# Patient Record
Sex: Male | Born: 1935
Health system: Southern US, Community
[De-identification: ages and names within clinical notes are randomized; demographics above are authoritative.]

## PROBLEM LIST (undated history)

## (undated) DIAGNOSIS — M51369 Other intervertebral disc degeneration, lumbar region without mention of lumbar back pain or lower extremity pain: Secondary | ICD-10-CM

## (undated) DIAGNOSIS — M1990 Unspecified osteoarthritis, unspecified site: Secondary | ICD-10-CM

## (undated) DIAGNOSIS — F32A Depression, unspecified: Secondary | ICD-10-CM

## (undated) DIAGNOSIS — R569 Unspecified convulsions: Secondary | ICD-10-CM

## (undated) DIAGNOSIS — M5030 Other cervical disc degeneration, unspecified cervical region: Secondary | ICD-10-CM

## (undated) DIAGNOSIS — E785 Hyperlipidemia, unspecified: Secondary | ICD-10-CM

## (undated) DIAGNOSIS — E875 Hyperkalemia: Secondary | ICD-10-CM

## (undated) DIAGNOSIS — N400 Enlarged prostate without lower urinary tract symptoms: Secondary | ICD-10-CM

## (undated) DIAGNOSIS — N529 Male erectile dysfunction, unspecified: Secondary | ICD-10-CM

## (undated) DIAGNOSIS — F419 Anxiety disorder, unspecified: Secondary | ICD-10-CM

## (undated) DIAGNOSIS — I10 Essential (primary) hypertension: Secondary | ICD-10-CM

## (undated) DIAGNOSIS — R972 Elevated prostate specific antigen [PSA]: Secondary | ICD-10-CM

## (undated) DIAGNOSIS — F329 Major depressive disorder, single episode, unspecified: Secondary | ICD-10-CM

## (undated) DIAGNOSIS — M1710 Unilateral primary osteoarthritis, unspecified knee: Secondary | ICD-10-CM

## (undated) DIAGNOSIS — E538 Deficiency of other specified B group vitamins: Secondary | ICD-10-CM

## (undated) DIAGNOSIS — E119 Type 2 diabetes mellitus without complications: Secondary | ICD-10-CM

## (undated) DIAGNOSIS — M5136 Other intervertebral disc degeneration, lumbar region: Secondary | ICD-10-CM

## (undated) DIAGNOSIS — E559 Vitamin D deficiency, unspecified: Secondary | ICD-10-CM

## (undated) DIAGNOSIS — H8113 Benign paroxysmal vertigo, bilateral: Secondary | ICD-10-CM

## (undated) HISTORY — DX: Anxiety disorder, unspecified: F41.9

## (undated) HISTORY — DX: Benign paroxysmal vertigo, bilateral: H81.13

## (undated) HISTORY — DX: Deficiency of other specified B group vitamins: E53.8

## (undated) HISTORY — DX: Major depressive disorder, single episode, unspecified: F32.9

## (undated) HISTORY — DX: Other intervertebral disc degeneration, lumbar region: M51.36

## (undated) HISTORY — DX: Other cervical disc degeneration, unspecified cervical region: M50.30

## (undated) HISTORY — DX: Male erectile dysfunction, unspecified: N52.9

## (undated) HISTORY — DX: Other intervertebral disc degeneration, lumbar region without mention of lumbar back pain or lower extremity pain: M51.369

## (undated) HISTORY — DX: Depression, unspecified: F32.A

## (undated) HISTORY — DX: Benign prostatic hyperplasia without lower urinary tract symptoms: N40.0

## (undated) HISTORY — DX: Hyperlipidemia, unspecified: E78.5

## (undated) HISTORY — DX: Hyperkalemia: E87.5

## (undated) HISTORY — PX: KNEE CARTILAGE SURGERY: SHX688

## (undated) HISTORY — DX: Unspecified osteoarthritis, unspecified site: M19.90

## (undated) HISTORY — DX: Elevated prostate specific antigen (PSA): R97.20

## (undated) HISTORY — DX: Vitamin D deficiency, unspecified: E55.9

## (undated) HISTORY — DX: Unilateral primary osteoarthritis, unspecified knee: M17.10

---

## 2000-12-14 ENCOUNTER — Other Ambulatory Visit: Admission: RE | Admit: 2000-12-14 | Discharge: 2000-12-14 | Payer: Self-pay | Admitting: Urology

## 2013-12-27 ENCOUNTER — Ambulatory Visit: Payer: Self-pay

## 2014-09-14 ENCOUNTER — Emergency Department (HOSPITAL_BASED_OUTPATIENT_CLINIC_OR_DEPARTMENT_OTHER): Payer: Commercial Managed Care - HMO

## 2014-09-14 ENCOUNTER — Encounter (HOSPITAL_BASED_OUTPATIENT_CLINIC_OR_DEPARTMENT_OTHER): Payer: Self-pay | Admitting: *Deleted

## 2014-09-14 ENCOUNTER — Emergency Department (HOSPITAL_BASED_OUTPATIENT_CLINIC_OR_DEPARTMENT_OTHER)
Admission: EM | Admit: 2014-09-14 | Discharge: 2014-09-14 | Disposition: A | Discharge status: Home / Self Care | Payer: Commercial Managed Care - HMO | Attending: Emergency Medicine | Admitting: Emergency Medicine

## 2014-09-14 DIAGNOSIS — G479 Sleep disorder, unspecified: Secondary | ICD-10-CM | POA: Insufficient documentation

## 2014-09-14 DIAGNOSIS — Z23 Encounter for immunization: Secondary | ICD-10-CM | POA: Diagnosis not present

## 2014-09-14 DIAGNOSIS — R531 Weakness: Secondary | ICD-10-CM | POA: Insufficient documentation

## 2014-09-14 DIAGNOSIS — Z79899 Other long term (current) drug therapy: Secondary | ICD-10-CM | POA: Insufficient documentation

## 2014-09-14 DIAGNOSIS — S51812A Laceration without foreign body of left forearm, initial encounter: Secondary | ICD-10-CM | POA: Insufficient documentation

## 2014-09-14 DIAGNOSIS — I10 Essential (primary) hypertension: Secondary | ICD-10-CM | POA: Insufficient documentation

## 2014-09-14 DIAGNOSIS — Z8739 Personal history of other diseases of the musculoskeletal system and connective tissue: Secondary | ICD-10-CM | POA: Diagnosis not present

## 2014-09-14 DIAGNOSIS — Y92832 Beach as the place of occurrence of the external cause: Secondary | ICD-10-CM | POA: Insufficient documentation

## 2014-09-14 DIAGNOSIS — S62629A Displaced fracture of medial phalanx of unspecified finger, initial encounter for closed fracture: Secondary | ICD-10-CM

## 2014-09-14 DIAGNOSIS — Y9289 Other specified places as the place of occurrence of the external cause: Secondary | ICD-10-CM | POA: Insufficient documentation

## 2014-09-14 DIAGNOSIS — S62632A Displaced fracture of distal phalanx of right middle finger, initial encounter for closed fracture: Secondary | ICD-10-CM | POA: Insufficient documentation

## 2014-09-14 DIAGNOSIS — Y998 Other external cause status: Secondary | ICD-10-CM | POA: Diagnosis not present

## 2014-09-14 DIAGNOSIS — Y9389 Activity, other specified: Secondary | ICD-10-CM | POA: Insufficient documentation

## 2014-09-14 DIAGNOSIS — W010XXA Fall on same level from slipping, tripping and stumbling without subsequent striking against object, initial encounter: Secondary | ICD-10-CM | POA: Diagnosis not present

## 2014-09-14 DIAGNOSIS — Z87891 Personal history of nicotine dependence: Secondary | ICD-10-CM | POA: Diagnosis not present

## 2014-09-14 DIAGNOSIS — Z794 Long term (current) use of insulin: Secondary | ICD-10-CM | POA: Insufficient documentation

## 2014-09-14 DIAGNOSIS — S62639A Displaced fracture of distal phalanx of unspecified finger, initial encounter for closed fracture: Secondary | ICD-10-CM | POA: Diagnosis not present

## 2014-09-14 DIAGNOSIS — S62662A Nondisplaced fracture of distal phalanx of right middle finger, initial encounter for closed fracture: Secondary | ICD-10-CM | POA: Diagnosis not present

## 2014-09-14 DIAGNOSIS — E119 Type 2 diabetes mellitus without complications: Secondary | ICD-10-CM | POA: Insufficient documentation

## 2014-09-14 DIAGNOSIS — R42 Dizziness and giddiness: Secondary | ICD-10-CM

## 2014-09-14 DIAGNOSIS — S0990XA Unspecified injury of head, initial encounter: Secondary | ICD-10-CM | POA: Diagnosis not present

## 2014-09-14 HISTORY — DX: Type 2 diabetes mellitus without complications: E11.9

## 2014-09-14 HISTORY — DX: Unspecified osteoarthritis, unspecified site: M19.90

## 2014-09-14 HISTORY — DX: Essential (primary) hypertension: I10

## 2014-09-14 HISTORY — DX: Unspecified convulsions: R56.9

## 2014-09-14 LAB — URINALYSIS, ROUTINE W REFLEX MICROSCOPIC
Bilirubin Urine: NEGATIVE
GLUCOSE, UA: 500 mg/dL — AB
Hgb urine dipstick: NEGATIVE
Ketones, ur: NEGATIVE mg/dL
NITRITE: NEGATIVE
PROTEIN: NEGATIVE mg/dL
SPECIFIC GRAVITY, URINE: 1.015 (ref 1.005–1.030)
UROBILINOGEN UA: 1 mg/dL (ref 0.0–1.0)
pH: 7 (ref 5.0–8.0)

## 2014-09-14 LAB — COMPREHENSIVE METABOLIC PANEL
ALT: 29 U/L (ref 0–53)
AST: 27 U/L (ref 0–37)
Albumin: 4 g/dL (ref 3.5–5.2)
Alkaline Phosphatase: 69 U/L (ref 39–117)
Anion gap: 6 (ref 5–15)
BUN: 15 mg/dL (ref 6–23)
CHLORIDE: 103 meq/L (ref 96–112)
CO2: 27 mmol/L (ref 19–32)
Calcium: 9 mg/dL (ref 8.4–10.5)
Creatinine, Ser: 1.08 mg/dL (ref 0.50–1.35)
GFR calc non Af Amer: 62 mL/min — ABNORMAL LOW (ref >90)
GFR, EST AFRICAN AMERICAN: 72 mL/min — AB (ref >90)
Glucose, Bld: 258 mg/dL — ABNORMAL HIGH (ref 70–99)
Potassium: 4.2 mmol/L (ref 3.5–5.1)
Sodium: 136 mmol/L (ref 135–145)
Total Bilirubin: 0.4 mg/dL (ref 0.3–1.2)
Total Protein: 7.1 g/dL (ref 6.0–8.3)

## 2014-09-14 LAB — CBC WITH DIFFERENTIAL/PLATELET
Basophils Absolute: 0.1 10*3/uL (ref 0.0–0.1)
Basophils Relative: 1 % (ref 0–1)
EOS ABS: 0.1 10*3/uL (ref 0.0–0.7)
Eosinophils Relative: 3 % (ref 0–5)
HCT: 41.6 % (ref 39.0–52.0)
Hemoglobin: 14.1 g/dL (ref 13.0–17.0)
LYMPHS ABS: 0.9 10*3/uL (ref 0.7–4.0)
Lymphocytes Relative: 17 % (ref 12–46)
MCH: 29.3 pg (ref 26.0–34.0)
MCHC: 33.9 g/dL (ref 30.0–36.0)
MCV: 86.5 fL (ref 78.0–100.0)
MONO ABS: 0.4 10*3/uL (ref 0.1–1.0)
MONOS PCT: 8 % (ref 3–12)
NEUTROS ABS: 3.7 10*3/uL (ref 1.7–7.7)
NEUTROS PCT: 71 % (ref 43–77)
PLATELETS: 193 10*3/uL (ref 150–400)
RBC: 4.81 MIL/uL (ref 4.22–5.81)
RDW: 13 % (ref 11.5–15.5)
WBC: 5.2 10*3/uL (ref 4.0–10.5)

## 2014-09-14 LAB — URINE MICROSCOPIC-ADD ON

## 2014-09-14 LAB — CBG MONITORING, ED: GLUCOSE-CAPILLARY: 239 mg/dL — AB (ref 70–99)

## 2014-09-14 MED ORDER — TETANUS-DIPHTH-ACELL PERTUSSIS 5-2.5-18.5 LF-MCG/0.5 IM SUSP
0.5000 mL | Freq: Once | INTRAMUSCULAR | Status: AC
  Administered 2014-09-14: 0.5 mL via INTRAMUSCULAR
  Filled 2014-09-14: qty 0.5

## 2014-09-14 NOTE — ED Provider Notes (Signed)
CSN: [id number]     Arrival date & time 09/14/14  1154 History   First MD Initiated Contact with Patient 09/14/14 1208     Chief Complaint  Patient presents with  . Fall     (Consider location/radiation/quality/duration/timing/severity/associated sxs/prior Treatment) HPI  81 year old male presents with dizziness since yesterday. In the morning he started feeling like he was off balance but has not fallen because of it. He was leaving the beach yesterday afternoon and slipped on the curb. Caught himself on his hands but injured his right middle finger and left forearm. Did not hit his head. The dizziness has persisted since then. The dizziness did not cause him to fall, he specifically remembers slipping on a wet curb. The patient denies any nausea, vomiting, focal weakness or numbness. He feels diffusely weak which is been an issue for several weeks. The patient denies any headache, chest pain, shortness of breath, or palpitations. The dizziness seems to most present when he is sitting up or moving. He has seemed unsteady on his feet to family. No confusion noted. Patient has had a history of inner ear problems and dizziness like this in the past but not quite as severe. Patient feels well right now does not complain of any dizziness.  Past Medical History  Diagnosis Date  . Arthritis   . Diabetes mellitus without complication   . Hypertension   . Seizures     as a child age 76   Past Surgical History  Procedure Laterality Date  . Knee surgery     No family history on file. History  Substance Use Topics  . Smoking status: Former Research scientist (life sciences)  . Smokeless tobacco: Current User    Types: Snuff  . Alcohol Use: No    Review of Systems  Constitutional: Negative for fever.  Eyes: Positive for visual disturbance.  Respiratory: Negative for shortness of breath.   Cardiovascular: Negative for chest pain and palpitations.  Gastrointestinal: Negative for nausea, vomiting and abdominal pain.    Musculoskeletal: Negative for joint swelling.  Skin: Positive for wound.  Neurological: Positive for dizziness and weakness (generalized). Negative for numbness and headaches.  All other systems reviewed and are negative.     Allergies  Ciprofloxacin  Home Medications   Prior to Admission medications   Medication Sig Start Date End Date Taking? Authorizing Provider  Insulin Detemir (LEVEMIR [place]) Inject into the skin.   Yes Historical Provider, MD  lisinopril (PRINIVIL,ZESTRIL) 20 MG tablet Take 20 mg by mouth daily.   Yes Historical Provider, MD  Mirabegron (MYRBETRIQ PO) Take by mouth.   Yes Historical Provider, MD  SitaGLIPtin-MetFORMIN HCl (JANUMET PO) Take by mouth.   Yes Historical Provider, MD  venlafaxine (EFFEXOR) 37.5 MG tablet Take 37.5 mg by mouth 2 (two) times daily. Pt takes irregularly   Yes Historical Provider, MD   BP 177/65 mmHg  Pulse 87  Temp(Src) 97.9 F (36.6 C) (Oral)  Resp 18  Ht 5\' 8"  (1.727 m)  Wt 185 lb (83.915 kg)  BMI 28.14 kg/m2  SpO2 98% Physical Exam  Constitutional: He is oriented to person, place, and time. He appears well-developed and well-nourished.  HENT:  Head: Normocephalic and atraumatic.  Right Ear: External ear normal.  Left Ear: External ear normal.  Nose: Nose normal.  Eyes: EOM are normal. Pupils are equal, round, and reactive to light. Right eye exhibits no discharge. Left eye exhibits no discharge.  Neck: Neck supple.  Cardiovascular: Normal rate, regular rhythm, normal heart sounds and  intact distal pulses.   Pulmonary/Chest: Effort normal.  Abdominal: Soft. There is no tenderness.  Musculoskeletal: He exhibits no edema.       Left forearm: He exhibits laceration (long linear abrasion without laceration). He exhibits no tenderness, no bony tenderness and no swelling.       Right hand: He exhibits tenderness and swelling.       Hands: Neurological: He is alert and oriented to person, place, and time.  CN 2-12 grossly  intact. 5/5 strength in all 4 extremities. Normal sensation. Normal finger to nose. Able to stand up and ambulate. Has no unsteadiness. Walks in a straight line with short steps which he and family state is normal for him.  Skin: Skin is warm and dry.  Nursing note and vitals reviewed.   ED Course  Procedures (including critical care time) Labs Review Labs Reviewed  COMPREHENSIVE METABOLIC PANEL - Abnormal; Notable for the following:    Glucose, Bld 258 (*)    GFR calc non Af Amer 62 (*)    GFR calc Af Amer 72 (*)    All other components within normal limits  URINALYSIS, ROUTINE W REFLEX MICROSCOPIC - Abnormal; Notable for the following:    Glucose, UA 500 (*)    Leukocytes, UA TRACE (*)    All other components within normal limits  CBG MONITORING, ED - Abnormal; Notable for the following:    Glucose-Capillary 239 (*)    All other components within normal limits  CBC WITH DIFFERENTIAL  URINE MICROSCOPIC-ADD ON    Imaging Review Dg Forearm Left  09/14/2014   CLINICAL DATA:  Left forearm laceration after falling yesterday.  EXAM: LEFT FOREARM - 2 VIEW  COMPARISON:  None.  FINDINGS: There is no evidence of fracture or other focal bone lesions. No radiopaque foreign body seen.  IMPRESSION: Normal left radius and ulna.   Electronically Signed   By: Sabino Dick M.D.   On: 09/14/2014 13:18   Ct Head Wo Contrast  09/14/2014   CLINICAL DATA:  Dizziness and unsteady gait. Recent fall. Diabetes. Hypertension.  EXAM: CT HEAD WITHOUT CONTRAST  TECHNIQUE: Contiguous axial images were obtained from the base of the skull through the vertex without intravenous contrast.  COMPARISON:  None.  FINDINGS: Dilated perivascular space or remote lacunar infarct along the inferior aspect of the right lentiform nucleus. Otherwise, the brainstem, cerebellum, cerebral peduncles, thalamus, basal ganglia, basilar cisterns, and ventricular system appear within normal limits. Periventricular white matter and corona  radiata hypodensities favor chronic ischemic microvascular white matter disease. No intracranial hemorrhage, mass lesion, or acute CVA.  Mild chronic left sphenoid sinusitis. There is atherosclerotic calcification of the cavernous carotid arteries bilaterally.  IMPRESSION: 1. No acute intracranial findings. 2. Periventricular white matter and corona radiata hypodensities favor chronic ischemic microvascular white matter disease. 3. Dilated perivascular space or remote lacunar infarct along the inferior aspect of the right lentiform nucleus. 4. Mild chronic left sphenoid sinusitis.   Electronically Signed   By: Sherryl Barters M.D.   On: 09/14/2014 13:24   Mr Brain Wo Contrast  09/14/2014   CLINICAL DATA:  Loss of balance. Gait issues. Leg weakness. Falls.  EXAM: MRI HEAD WITHOUT CONTRAST  TECHNIQUE: Multiplanar, multiecho pulse sequences of the brain and surrounding structures were obtained without intravenous contrast.  COMPARISON:  CT head from the same day.  FINDINGS: The diffusion-weighted images demonstrate no evidence for acute or subacute infarction. Advanced confluent periventricular and subcortical white matter changes are noted bilaterally. There are  remote lacunar infarcts within the basal ganglia bilaterally.  The ventricles are proportionate to the degree of atrophy. No significant extraaxial fluid collection is present.  Flow is present in the major intracranial arteries. The globes and orbits are intact. Mild mucosal thickening is present in the paranasal sinuses. The mastoid air cells are clear.  IMPRESSION: 1. No acute intracranial abnormality. 2. Him advanced periventricular and subcortical white matter changes bilaterally likely reflect the sequela of chronic microvascular ischemia. 3. Remote lacunar infarcts and dilated perivascular spaces within the basal ganglia bilaterally.   Electronically Signed   By: Lawrence Santiago M.D.   On: 09/14/2014 15:33   Dg Hand Complete Right  09/14/2014    CLINICAL DATA:  Initial evaluation for pain distal right middle finger after falling yesterday  EXAM: RIGHT HAND - COMPLETE 3+ VIEW  COMPARISON:  None.  FINDINGS: There is a fracture involving the dorsal base of the third distal phalanx. It is minimally nipple placed with a fracture line extending into the distal interphalangeal joint. There are no other fractures.  IMPRESSION: Fracture distal phalanx   Electronically Signed   By: Skipper Cliche M.D.   On: 09/14/2014 13:16     EKG Interpretation   Date/Time:  Saturday September 14 2014 12:30:45 EST Ventricular Rate:  82 PR Interval:  156 QRS Duration: 132 QT Interval:  398 QTC Calculation: 464 R Axis:   1 Text Interpretation:  Normal sinus rhythm Right bundle branch block  Abnormal ECG No old tracing to compare Confirmed by Nery Kalisz  MD, Milbern Doescher  ([id number]) on 09/14/2014 12:41:38 PM      MDM   Final diagnoses:  Dizziness  Fracture of finger, middle phalanx, right, closed, initial encounter    Patient's dizziness is likely a per referral vertigo. Given his age and MRI was obtained and shows no acute infarct. CT imaging is also negative. X-ray does show a nondisplaced distal finger fracture from his fall yesterday. This was placed in a splint. He was given tetanus immunization. The patient feels as though his dizziness has significantly improved and he is able to get up and ambulate. What he is describing his dizziness sounds like a vertigo and not a near syncopal event and he has no feelings like he's got a pass out. Workup here is unremarkable and feel he is stable for discharge with close outpatient follow-up with his PCP.    Ephraim Hamburger, MD 09/14/14 [phone]

## 2014-09-14 NOTE — ED Notes (Addendum)
Reports fell stepping over curb yesterday while at beach yesterday, felt dizzy- bandages in place to left forearm and right middle finger- states he is still feeling dizzy today

## 2014-09-14 NOTE — ED Notes (Signed)
MD at bedside. 

## 2014-09-14 NOTE — ED Notes (Signed)
Patient ambulated to restroom and back without any complications. States he feels better than he did when he came in.

## 2014-09-16 DIAGNOSIS — R251 Tremor, unspecified: Secondary | ICD-10-CM | POA: Diagnosis not present

## 2014-09-16 DIAGNOSIS — F329 Major depressive disorder, single episode, unspecified: Secondary | ICD-10-CM | POA: Diagnosis not present

## 2014-09-16 DIAGNOSIS — Z6827 Body mass index (BMI) 27.0-27.9, adult: Secondary | ICD-10-CM | POA: Diagnosis not present

## 2014-09-16 DIAGNOSIS — H8113 Benign paroxysmal vertigo, bilateral: Secondary | ICD-10-CM | POA: Diagnosis not present

## 2014-09-17 DIAGNOSIS — Z992 Dependence on renal dialysis: Secondary | ICD-10-CM | POA: Diagnosis not present

## 2014-09-17 DIAGNOSIS — M1711 Unilateral primary osteoarthritis, right knee: Secondary | ICD-10-CM | POA: Diagnosis not present

## 2014-09-17 DIAGNOSIS — M1712 Unilateral primary osteoarthritis, left knee: Secondary | ICD-10-CM | POA: Diagnosis not present

## 2014-10-07 DIAGNOSIS — M1712 Unilateral primary osteoarthritis, left knee: Secondary | ICD-10-CM | POA: Diagnosis not present

## 2014-11-11 DIAGNOSIS — E538 Deficiency of other specified B group vitamins: Secondary | ICD-10-CM | POA: Diagnosis not present

## 2014-11-11 DIAGNOSIS — I10 Essential (primary) hypertension: Secondary | ICD-10-CM | POA: Diagnosis not present

## 2014-11-11 DIAGNOSIS — E1140 Type 2 diabetes mellitus with diabetic neuropathy, unspecified: Secondary | ICD-10-CM | POA: Diagnosis not present

## 2014-11-11 DIAGNOSIS — E785 Hyperlipidemia, unspecified: Secondary | ICD-10-CM | POA: Diagnosis not present

## 2014-11-26 DIAGNOSIS — M1711 Unilateral primary osteoarthritis, right knee: Secondary | ICD-10-CM | POA: Diagnosis not present

## 2014-11-26 DIAGNOSIS — M1712 Unilateral primary osteoarthritis, left knee: Secondary | ICD-10-CM | POA: Diagnosis not present

## 2014-11-29 ENCOUNTER — Ambulatory Visit (INDEPENDENT_AMBULATORY_CARE_PROVIDER_SITE_OTHER): Payer: Commercial Managed Care - HMO | Admitting: Cardiology

## 2014-11-29 ENCOUNTER — Encounter: Payer: Self-pay | Admitting: Cardiology

## 2014-11-29 VITALS — BP 132/82 | HR 79 | Ht 68.0 in | Wt 179.0 lb

## 2014-11-29 DIAGNOSIS — Z01818 Encounter for other preprocedural examination: Secondary | ICD-10-CM | POA: Diagnosis not present

## 2014-11-29 DIAGNOSIS — I4510 Unspecified right bundle-branch block: Secondary | ICD-10-CM | POA: Diagnosis not present

## 2014-11-29 DIAGNOSIS — M25561 Pain in right knee: Secondary | ICD-10-CM | POA: Diagnosis not present

## 2014-11-29 DIAGNOSIS — R011 Cardiac murmur, unspecified: Secondary | ICD-10-CM | POA: Diagnosis not present

## 2014-11-29 DIAGNOSIS — M25569 Pain in unspecified knee: Secondary | ICD-10-CM | POA: Insufficient documentation

## 2014-11-29 NOTE — Patient Instructions (Signed)
Your physician has requested that you have an echocardiogram. Echocardiography is a painless test that uses sound waves to create images of your heart. It provides your doctor with information about the size and shape of your heart and how well your heart's chambers and valves are working. This procedure takes approximately one hour. There are no restrictions for this procedure.  Your physician has requested that you have a lexiscan myoview. For further information please visit [email or web address]. Please follow instruction sheet, as given.  Your physician recommends that you schedule a follow-up appointment AS NEEDED with Dr. Radford Pax pending the results of your test.

## 2014-11-29 NOTE — Progress Notes (Signed)
Cardiology Office Note   Date:  11/29/2014   ID:  Jennefer Bravo Stoke, DOB 1931-05-17, MRN [id number]  PCP:  Nicoletta Dress, MD  Cardiologist:   Sueanne Margarita, MD   No chief complaint on file.     History of Present Illness: Richard Randolph is a 81 y.o. male who presents for evaluation.  He has a history of DM, HTN, dyslipidemia who is referred by his PCP and orthopedist for preoperative clearance for knee surgery on his right knee.  He walks with a cane.  He denies any chest pain, SOB, DOE, palpitations or syncope.  His exercise is very limited by his knee pain.      Past Medical History  Diagnosis Date  . Arthritis   . Diabetes mellitus without complication   . Hypertension   . Seizures     as a child age 87  . Degenerative cervical disc   . Hyperlipidemia   . Benign enlargement of prostate   . Lumbar disc narrowing   . Osteoarthrosis   . Anxiety   . Arthritis of knee   . Failure of erection   . Elevated prostate specific antigen (PSA)   . Vitamin D deficiency   . Vitamin B12 deficiency   . Hyperkalemia   . Benign paroxysmal positional vertigo due to bilateral vestibular disorder   . Depression     Past Surgical History  Procedure Laterality Date  . Knee surgery    . Knee cartilage surgery Bilateral      Current Outpatient Prescriptions  Medication Sig Dispense Refill  . ALPRAZolam (XANAX) 0.25 MG tablet Take 0.25 mg by mouth 3 (three) times daily.    Marland Kitchen amLODipine (NORVASC) 5 MG tablet Take 5 mg by mouth daily.    Marland Kitchen atorvastatin (LIPITOR) 10 MG tablet Take 10 mg by mouth daily.    . fenofibrate (TRICOR) 145 MG tablet Take 145 mg by mouth daily.    Marland Kitchen glipiZIDE (GLUCOTROL) 5 MG tablet Take 5 mg by mouth 2 (two) times daily before a meal.    . Ibuprofen 200 MG CAPS Take 200 mg by mouth as needed.     . Insulin Detemir (LEVEMIR [place]) Inject 30 Units into the skin at bedtime.     Marland Kitchen LEVEMIR FLEXTOUCH 100 UNIT/ML Pen Inject 100 Units into the skin at bedtime.     Marland Kitchen  lisinopril (PRINIVIL,ZESTRIL) 20 MG tablet Take 20 mg by mouth daily.    . Omega-3 Fatty Acids (FISH OIL) 1000 MG CAPS Take 1,000 mg by mouth 2 (two) times daily.    . SitaGLIPtin-MetFORMIN HCl (JANUMET PO) Take 50-500 mg by mouth 2 (two) times daily.      No current facility-administered medications for this visit.    Allergies:   Ciprofloxacin    Social History:  The patient  reports that he has quit smoking. He has quit using smokeless tobacco. His smokeless tobacco use included Snuff. He reports that he does not drink alcohol or use illicit drugs.   Family History:  The patient's family history includes Stroke in his father.    ROS:  Please see the history of present illness.   Otherwise, review of systems are positive for none.   All other systems are reviewed and negative.    PHYSICAL EXAM: VS:  BP 132/82 mmHg  Pulse 79  Ht 5\' 8"  (1.727 m)  Wt 179 lb (81.194 kg)  BMI 27.22 kg/m2 , BMI Body mass index is 27.22 kg/(m^2). GEN: Well  nourished, well developed, in no acute distress HEENT: normal Neck: no JVD, carotid bruits, or masses Cardiac: RRR; no rubs, or gallops,no edema.  2/6 SM at RUSB Respiratory:  clear to auscultation bilaterally, normal work of breathing GI: soft, nontender, nondistended, + BS MS: no deformity or atrophy Skin: warm and dry, no rash Neuro:  Strength and sensation are intact Psych: euthymic mood, full affect   EKG:  EKG is not ordered today. The ekg ordered 1/4/2016demonstrates NSR with RBBB   Recent Labs: 09/14/2014: ALT 29; BUN 15; Creatinine 1.08; Hemoglobin 14.1; Platelets 193; Potassium 4.2; Sodium 136    Lipid Panel No results found for: CHOL, TRIG, HDL, CHOLHDL, VLDL, LDLCALC, LDLDIRECT    Wt Readings from Last 3 Encounters:  11/29/14 179 lb (81.194 kg)  09/14/14 185 lb (83.915 kg)       ASSESSMENT AND PLAN:  1.  Preoperative cardiac clearance for knee surgery.  He is asymptomatic from a cardiac standpoint but he is unable to  exert himself to 4 mets due to knee pain.  He has a RBBB and I have no old EKG to compare to.  He has CRF including HTN, dyslipidemia, DM.  I will get a Lexiscan myoview to assess further for ischemia. 2.  HTN 3.  Dyslipemia 4.  DM 5.  Heart murmur- check 2D echo to assess LVF 6.  RBBB   Current medicines are reviewed at length with the patient today.  The patient does not have concerns regarding medicines.  The following changes have been made:  no change  Labs/ tests ordered today include: Lexiscan myoveiw and 2D echo   Orders Placed This Encounter  Procedures  . Myocardial Perfusion Imaging  . 2D Echocardiogram without contrast     Disposition:   FU with me PRN pending results of studies   Signed, Sueanne Margarita, MD  11/29/2014 2:30 PM    [address], [place], [place]  [address] Phone: [phone]; Fax: [phone]

## 2014-12-02 ENCOUNTER — Other Ambulatory Visit: Payer: Self-pay

## 2014-12-02 ENCOUNTER — Ambulatory Visit (HOSPITAL_COMMUNITY): Payer: Commercial Managed Care - HMO | Attending: Internal Medicine | Admitting: Radiology

## 2014-12-02 ENCOUNTER — Ambulatory Visit (HOSPITAL_BASED_OUTPATIENT_CLINIC_OR_DEPARTMENT_OTHER): Payer: Commercial Managed Care - HMO

## 2014-12-02 DIAGNOSIS — R011 Cardiac murmur, unspecified: Secondary | ICD-10-CM

## 2014-12-02 DIAGNOSIS — Z01818 Encounter for other preprocedural examination: Secondary | ICD-10-CM

## 2014-12-02 DIAGNOSIS — I10 Essential (primary) hypertension: Secondary | ICD-10-CM | POA: Insufficient documentation

## 2014-12-02 DIAGNOSIS — E119 Type 2 diabetes mellitus without complications: Secondary | ICD-10-CM | POA: Insufficient documentation

## 2014-12-02 MED ORDER — REGADENOSON 0.4 MG/5ML IV SOLN
0.4000 mg | Freq: Once | INTRAVENOUS | Status: AC
  Administered 2014-12-02: 0.4 mg via INTRAVENOUS

## 2014-12-02 MED ORDER — TECHNETIUM TC 99M SESTAMIBI GENERIC - CARDIOLITE
33.0000 | Freq: Once | INTRAVENOUS | Status: AC | PRN
  Administered 2014-12-02: 33 via INTRAVENOUS

## 2014-12-02 MED ORDER — TECHNETIUM TC 99M SESTAMIBI GENERIC - CARDIOLITE
11.0000 | Freq: Once | INTRAVENOUS | Status: AC | PRN
  Administered 2014-12-02: 11 via INTRAVENOUS

## 2014-12-02 NOTE — Progress Notes (Signed)
Richard Randolph [facility] NUCLEAR MED [address] [place], [place] [address] [phone]    Cardiology Nuclear Med Study  Richard Randolph is a 81 y.o. male     MRN : [id number]     DOB: [birth year]  Procedure Date: 12/02/2014  Nuclear Med Background Indication for Stress Test:  Evaluation for Ischemia and Surgical Clearance-knee surgery- Dr Joni Fears History:  RBBB, No Known CAD Hx Cardiac Risk Factors: Hypertension and NIDDM  Symptoms:  No symptoms   Nuclear Pre-Procedure Caffeine/Decaff Intake:  None NPO After: 6 pm   Lungs:  clear O2 Sat: 98% on room air. IV 0.9% NS with Angio Cath:  20g  IV Site: R Wrist  IV Started by:  Perrin Maltese, EMT-P  Chest Size (in):  48 Cup Size: n/a  Height: 5\' 8"  (1.727 m)  Weight:  176 lb (79.833 kg)  BMI:  Body mass index is 26.77 kg/(m^2). Tech Comments:  N/A    Nuclear Med Study 1 or 2 day study: 1 day  Stress Test Type:  Lexiscan  Reading MD: N/A  Order Authorizing Provider:  Fransico Him, MD  Resting Radionuclide: Technetium 38m Sestamibi  Resting Radionuclide Dose: 11.0 mCi   Stress Radionuclide:  Technetium 43m Sestamibi  Stress Radionuclide Dose: 33.0 mCi           Stress Protocol Rest HR: 70 Stress HR: 90  Rest BP: 146/74 Stress BP: 151/70  Exercise Time (min): n/a METS: n/a   Predicted Max HR: 139 bpm % Max HR: 64.75 bpm Rate Pressure Product: 13590   Dose of Adenosine (mg):  n/a Dose of Lexiscan: 0.4 mg  Dose of Atropine (mg): n/a Dose of Dobutamine: n/a mcg/kg/min (at max HR)  Stress Test Technologist: Richard Randolph, EMT-P  Nuclear Technologist:  Richard Randolph, CNMT     Rest Procedure:  Myocardial perfusion imaging was performed at rest 45 minutes following the intravenous administration of Technetium 22m Sestamibi. Rest ECG: NSR-RBBB  Stress Procedure:  The patient received IV Lexiscan 0.4 mg over 15-seconds.  Technetium 75m Sestamibi injected at 30-seconds.  Quantitative spect images were obtained  after a 45 minute delay. Stress ECG: No significant ST segment change suggestive of ischemia.  QPS Raw Data Images:  Normal; no motion artifact; normal heart/lung ratio. Stress Images:  Normal homogeneous uptake in all areas of the myocardium. Rest Images:  Normal homogeneous uptake in all areas of the myocardium. Subtraction (SDS):  No evidence of ischemia. Transient Ischemic Dilatation (Normal <1.22):  1.24 Lung/Heart Ratio (Normal <0.45):  0.28  Quantitative Gated Spect Images QGS EDV:  80 ml QGS ESV:  33 ml  Impression Exercise Capacity:  Lexiscan with no exercise. BP Response:  Normal blood pressure response. Clinical Symptoms:  No chest pain. ECG Impression:  No significant ST segment change suggestive of ischemia. Comparison with Prior Nuclear Study: No images to compare  Overall Impression:  Normal stress nuclear study.  LV Ejection Fraction: 58%.  LV Wall Motion:  NL LV Function; NL Wall Motion  Darlin Coco MD

## 2014-12-02 NOTE — Progress Notes (Signed)
2D Echo completed. 12/02/2014 

## 2014-12-03 DIAGNOSIS — R972 Elevated prostate specific antigen [PSA]: Secondary | ICD-10-CM | POA: Diagnosis not present

## 2014-12-03 DIAGNOSIS — N401 Enlarged prostate with lower urinary tract symptoms: Secondary | ICD-10-CM | POA: Diagnosis not present

## 2014-12-11 DIAGNOSIS — M1711 Unilateral primary osteoarthritis, right knee: Secondary | ICD-10-CM | POA: Diagnosis present

## 2014-12-18 DIAGNOSIS — M1711 Unilateral primary osteoarthritis, right knee: Secondary | ICD-10-CM | POA: Diagnosis not present

## 2014-12-20 NOTE — H&P (Addendum)
CHIEF COMPLAINT:  Painful right knee.   HISTORY OF PRESENT ILLNESS:  Richard Randolph is a very pleasant, 81 year old, white, widowed male who is seen today for evaluation of his right knee.  He has had problems with the right knee dating back to probably around 25-30 years ago, and he has had worsening pain and discomfort.  About 40 years ago in the 1970's, he did have a meniscectomy performed in both knees.  This was through an open procedure.  He had been having so much pain that in January, he actually went to the [facility] and had x-rays and then was referred initially to [place].  He states that he has had pain with the knees since his previous surgeries, but now it has gotten to the point where he is having pain with every step.  He denies any radicular pain or any pain in the calf.  His pain is centered mainly around both knees, but the right is worse than the left.  He has not really had much swelling, but he does have instability symptoms on the right more than the left.  This is affecting his ability to do his activities of daily living.  He cannot straighten his knee fully, which also has been a problem for him.  He has had corticosteroid injections which have been somewhat beneficial.  He does have chondrocalcinosis CPPD in both knees with the right greater than the left.  It is noted on x-rays that he has decreased medial joint space by about 35-40 degrees.  He is seen today for evaluation.   PAST MEDICAL HISTORY:  In general, his health is good.    PAST SURGICAL HISTORY:  He had a hospitalization in 1970 for bilateral cartilage meniscectomy of the knees.   CURRENT MEDICATIONS:  1.  Levemir 25 units daily. 2.  Lisinopril 20 mg daily. 3.  Alprazolam 0.25 mg p.r.n. 4.  Glipizide 5 mg b.i.d. 5.  Flomax p.r.n.   ALLERGIES:  NO KNOWN DRUG ALLERGIES.   REVIEW OF SYSTEMS:  Fourteen-point review of systems is negative except for decreased hearing as well as the use of glasses for changes in vision.   He does have upper and lower dentures.  He apparently had chest pain and had a cardiac catheterization several years ago, and that was negative.  He has had hypertension for 10-15 years that is medically treated.  He does have problems with diarrhea and constipation at times.  He has been a diabetic for 20 years.  He does also have problems with nocturia 1-3 times per day as well as some hesitancy and problems with prostate hypertrophy.  He did have seizures as a child but none since then.  He does have a history of nervous exhaustion as well as depression.   SOCIAL HISTORY:  Richard Randolph is an 81 year old, white, widowed male who is retired from being [employer]-employed.  He smoked a pipe for 30 years and quit in 1990.  He does not drink.   FAMILY HISTORY:  Positive for a mother who died at 73 years of age from a stroke.  She did have hypertension and seizures as well as arthritis.  The father died at age 24 from a stroke.  He did have hypertension and mental illness and strokes.  He has a brother who is 78 years old and who does not necessarily have any medical concerns but was an Agent [employer] recipient and certainly does have some interesting problems that cannot be determined.   PHYSICAL EXAMINATION:  Reveals a very pleasant, 81 year old, white male who is well developed, well nourished, alert, pleasant, cooperative, and in moderate distress secondary to bilateral knee pain,  right greater than left.  His height is 65.5 inches.  His weight is 180 pounds.  His BMI is 29.5.   Vital signs:  Temperature 95.7.  Pulse 80.  Respirations 16.  Blood pressure 160/80. Head:  Normocephalic. Eyes:  Pupils are equal, round, and reactive to light and accommodation with extraocular movements intact. Ears, nose, and throat:  Benign. Neck:  Supple.  No carotid bruits. Chest:  Good expansion. Lungs:  Essentially clear. Cardiac:  He had a regular rhythm and rate with distant heart sounds with a grade 5-4/9 systolic ejection  murmur. Lower extremities:  Pulses were 1+ bilateral and symmetric in the lower extremities. Abdominal:  Scaphoid soft and tender.  No mass palpable.  Normal bowel sounds present. Genital:  Not indicated for an orthopedic evaluation. Rectal:  Not indicated for an orthopedic evaluation. Breasts:  Not indicated for an orthopedic evaluation. Neurological:  He is oriented x3.  Cranial nerves 2-12 are grossly intact. Musculoskeletal:  At this time, he has range of motion from about 15 degrees.  He has sciatic full extension to only about 90 degrees of flexion on the right knee.  He does have a little bit of an effusion.  It is difficult to examine because of the fact that I cannot get him straightened further.  He is tender over the joint lines bilaterally both medial and lateral.  I can feel some periarticular spurring more medial than lateral on the knees.  Patellofemoral crepitance is also noted.   CLINICAL IMPRESSION:   1.  End stage osteoarthritis of both knees, right greater than left. 2.  History of hypertension. 3.  History of diabetes mellitus. 4.  Prostatic hypertrophy.   RECOMMENDATIONS:   1.  At this time, I have reviewed a note from Dr. Delena Bali from [facility] who feels that from a medical and cardiac clearance standpoint, that he is safe to have a total knee replacement. 2.  Therefore, the procedure, risks, and benefits were fully explained to the patient in detail.  All questions were answered for him and his daughter.  We will schedule him for a right total knee arthroplasty in the very near future.  Mike Craze [place], [place] [phone]  12/26/2014 3:46 PM

## 2014-12-21 NOTE — Pre-Procedure Instructions (Signed)
Avrum R Irving  12/21/2014   Your procedure is scheduled on:  April 19  Report to [place] Admitting at 08:00 AM.  Call this number if you have problems the morning of surgery: [phone]   Remember:   Do not eat food or drink liquids after midnight.   Take these medicines the morning of surgery with A SIP OF WATER: None    Decrease dose of Levemir to 12 units on April 18 ONLY   STOP B-12, Ibuprofen, Fish Oil today   STOP/ Do not take Aspirin, Aleve, Naproxen, Advil, Ibuprofen, Motrin, Vitamins, Herbs, or Supplements starting today   Do not wear jewelry, make-up or nail polish.  Do not wear lotions, powders, or perfumes. You may wear deodorant.  Do not shave 48 hours prior to surgery. Men may shave face and neck.  Do not bring valuables to the hospital.  [facility] is not responsible for any belongings or valuables.               Contacts, dentures or bridgework may not be worn into surgery.  Leave suitcase in the car. After surgery it may be brought to your room.  For patients admitted to the hospital, discharge time is determined by your treatment team.                 Special Instructions: [place] - Preparing for Surgery  Before surgery, you can play an important role.  Because skin is not sterile, your skin needs to be as free of germs as possible.  You can reduce the number of germs on you skin by washing with CHG (chlorahexidine gluconate) soap before surgery.  CHG is an antiseptic cleaner which kills germs and bonds with the skin to continue killing germs even after washing.  Please DO NOT use if you have an allergy to CHG or antibacterial soaps.  If your skin becomes reddened/irritated stop using the CHG and inform your nurse when you arrive at Short Stay.  Do not shave (including legs and underarms) for at least 48 hours prior to the first CHG shower.  You may shave your face.  Please follow these instructions carefully:   1.  Shower with CHG Soap the  night before surgery and the morning of Surgery.  2.  If you choose to wash your hair, wash your hair first as usual with your normal shampoo.  3.  After you shampoo, rinse your hair and body thoroughly to remove the shampoo.  4.  Use CHG as you would any other liquid soap.  You can apply CHG directly to the skin and wash gently with scrungie or a clean washcloth.  5.  Apply the CHG Soap to your body ONLY FROM THE NECK DOWN.  Do not use on open wounds or open sores.  Avoid contact with your eyes, ears, mouth and genitals (private parts).  Wash genitals (private parts) with your normal soap.  6.  Wash thoroughly, paying special attention to the area where your surgery will be performed.  7.  Thoroughly rinse your body with warm water from the neck down.  8.  DO NOT shower/wash with your normal soap after using and rinsing off the CHG Soap.  9.  Pat yourself dry with a clean towel.            10.  Wear clean pajamas.            11.  Place clean sheets on your bed the  night of your first shower and do not sleep with pets.  Day of Surgery  Do not apply any lotions the morning of surgery.  Please wear clean clothes to the hospital/surgery center.     Please read over the following fact sheets that you were given: Pain Booklet, Coughing and Deep Breathing, Blood Transfusion Information, Total Joint Packet and Surgical Site Infection Prevention

## 2014-12-23 ENCOUNTER — Ambulatory Visit (HOSPITAL_COMMUNITY)
Admission: RE | Admit: 2014-12-23 | Discharge: 2014-12-23 | Disposition: A | Discharge status: Home / Self Care | Payer: Commercial Managed Care - HMO | Source: Ambulatory Visit | Attending: Orthopedic Surgery | Admitting: Orthopedic Surgery

## 2014-12-23 ENCOUNTER — Encounter (HOSPITAL_COMMUNITY): Payer: Self-pay

## 2014-12-23 ENCOUNTER — Encounter (HOSPITAL_COMMUNITY)
Admission: RE | Admit: 2014-12-23 | Discharge: 2014-12-23 | Disposition: A | Discharge status: Home / Self Care | Payer: Commercial Managed Care - HMO | Source: Ambulatory Visit | Attending: Orthopaedic Surgery | Admitting: Orthopaedic Surgery

## 2014-12-23 DIAGNOSIS — M25561 Pain in right knee: Secondary | ICD-10-CM | POA: Diagnosis not present

## 2014-12-23 DIAGNOSIS — Z01818 Encounter for other preprocedural examination: Secondary | ICD-10-CM | POA: Diagnosis not present

## 2014-12-23 DIAGNOSIS — Z01811 Encounter for preprocedural respiratory examination: Secondary | ICD-10-CM | POA: Diagnosis not present

## 2014-12-23 DIAGNOSIS — Z01812 Encounter for preprocedural laboratory examination: Secondary | ICD-10-CM | POA: Insufficient documentation

## 2014-12-23 HISTORY — DX: Type 2 diabetes mellitus without complications: E11.9

## 2014-12-23 LAB — URINALYSIS, ROUTINE W REFLEX MICROSCOPIC
Bilirubin Urine: NEGATIVE
Glucose, UA: >1000 mg/dL — AB
Hgb urine dipstick: NEGATIVE
Ketones, ur: NEGATIVE mg/dL
Leukocytes, UA: NEGATIVE
Nitrite: NEGATIVE
Protein, ur: NEGATIVE mg/dL
Specific Gravity, Urine: 1.016 (ref 1.005–1.030)
Urobilinogen, UA: 1 mg/dL (ref 0.0–1.0)
pH: 6 (ref 5.0–8.0)

## 2014-12-23 LAB — CBC WITH DIFFERENTIAL/PLATELET
Basophils Absolute: 0 10*3/uL (ref 0.0–0.1)
Basophils Relative: 0 % (ref 0–1)
Eosinophils Absolute: 0.1 10*3/uL (ref 0.0–0.7)
Eosinophils Relative: 1 % (ref 0–5)
HCT: 43.4 % (ref 39.0–52.0)
Hemoglobin: 14.4 g/dL (ref 13.0–17.0)
Lymphocytes Relative: 9 % — ABNORMAL LOW (ref 12–46)
Lymphs Abs: 0.7 10*3/uL (ref 0.7–4.0)
MCH: 28.8 pg (ref 26.0–34.0)
MCHC: 33.2 g/dL (ref 30.0–36.0)
MCV: 86.8 fL (ref 78.0–100.0)
Monocytes Absolute: 0.6 10*3/uL (ref 0.1–1.0)
Monocytes Relative: 8 % (ref 3–12)
Neutro Abs: 6.1 10*3/uL (ref 1.7–7.7)
Neutrophils Relative %: 82 % — ABNORMAL HIGH (ref 43–77)
Platelets: 229 10*3/uL (ref 150–400)
RBC: 5 MIL/uL (ref 4.22–5.81)
RDW: 13.3 % (ref 11.5–15.5)
WBC: 7.5 10*3/uL (ref 4.0–10.5)

## 2014-12-23 LAB — COMPREHENSIVE METABOLIC PANEL
ALT: 25 U/L (ref 0–53)
AST: 25 U/L (ref 0–37)
Albumin: 3.9 g/dL (ref 3.5–5.2)
Alkaline Phosphatase: 73 U/L (ref 39–117)
Anion gap: 8 (ref 5–15)
BUN: 13 mg/dL (ref 6–23)
CO2: 24 mmol/L (ref 19–32)
Calcium: 9.5 mg/dL (ref 8.4–10.5)
Chloride: 103 mmol/L (ref 96–112)
Creatinine, Ser: 1.39 mg/dL — ABNORMAL HIGH (ref 0.50–1.35)
GFR calc Af Amer: 53 mL/min — ABNORMAL LOW (ref >90)
GFR calc non Af Amer: 46 mL/min — ABNORMAL LOW (ref >90)
Glucose, Bld: 235 mg/dL — ABNORMAL HIGH (ref 70–99)
Potassium: 4.6 mmol/L (ref 3.5–5.1)
Sodium: 135 mmol/L (ref 135–145)
Total Bilirubin: 0.5 mg/dL (ref 0.3–1.2)
Total Protein: 7.2 g/dL (ref 6.0–8.3)

## 2014-12-23 LAB — TYPE AND SCREEN
ABO/RH(D): O POS
Antibody Screen: NEGATIVE

## 2014-12-23 LAB — PROTIME-INR
INR: 1.04 (ref 0.00–1.49)
Prothrombin Time: 13.7 seconds (ref 11.6–15.2)

## 2014-12-23 LAB — URINE MICROSCOPIC-ADD ON

## 2014-12-23 LAB — APTT: aPTT: 36 seconds (ref 24–37)

## 2014-12-23 LAB — ABO/RH: ABO/RH(D): O POS

## 2014-12-23 LAB — SURGICAL PCR SCREEN
MRSA, PCR: NEGATIVE
STAPHYLOCOCCUS AUREUS: NEGATIVE

## 2014-12-23 NOTE — Progress Notes (Addendum)
Per [place], PA patient can take 1/2 dose of levemir the night before surgery. Patient denies CP, Shob. Murvin Donning is PCP. Dr. Radford Pax is cardiologist. Stress test and echo noted on chart

## 2014-12-23 NOTE — Progress Notes (Signed)
   12/23/14 1232  OBSTRUCTIVE SLEEP APNEA  Have you ever been diagnosed with sleep apnea through a sleep study? No  Do you snore loudly (loud enough to be heard through closed doors)?  0  Do you often feel tired, fatigued, or sleepy during the daytime? 0  Has anyone observed you stop breathing during your sleep? 0  Do you have, or are you being treated for high blood pressure? 1  BMI more than 35 kg/m2? 0  Age over 79 years old? 1  Neck circumference greater than 40 cm/16 inches? 1  Gender: 1  Obstructive Sleep Apnea Score 4

## 2014-12-24 LAB — URINE CULTURE
COLONY COUNT: NO GROWTH
Culture: NO GROWTH

## 2014-12-24 NOTE — Progress Notes (Signed)
Anesthesia Chart Review:  Pt is 81 year old male scheduled for R total knee arthroplasty on 12/31/2014 with Dr. Durward Fortes.   PMH includes: HTN, hyperlipidemia, DM, seizures, BPPV, vitamin B12 deficiency. Former smoker. BMI 27.   Preoperative labs reviewed.  Glucose 235.   Chest x-ray reviewed. No active cardiopulmonary disease.   EKG 09/16/14: NSR. RBBB.   Echo 12/02/2014:  - Left ventricle: The cavity size was normal. There was moderate focal basal hypertrophy. Systolic function was normal. The estimated ejection fraction was in the range of 60% to 65%. Wall motion was normal; there were no regional wall motion abnormalities. There was an increased relative contribution of atrial contraction to ventricular filling. Doppler parameters are consistent with abnormal left ventricular relaxation (grade 1 diastolic dysfunction). - Aortic valve: Moderately calcified annulus. Trileaflet. Mild calcification, consistent with sclerosis. There was mild regurgitation. Valve area (VTI): 1.96 cm^2. Valve area (Vmax): 1.65 cm^2. Valve area (Vmean): 1.81 cm^2.  Nuclear stress test 12/02/2014: Normal stress nuclear study. LV Ejection Fraction: 58%. LV Wall Motion: NL LV Function; NL Wall Motion.  Pt saw Dr. Radford Pax for pre-operative evaluation and is cleared for surgery from a cardiac standpoint.   If no changes, I anticipate pt can proceed with surgery as scheduled.   Willeen Cass, FNP-BC [facility] Short Stay Surgical Center/Anesthesiology Phone: [phone] 12/24/2014 1:06 PM

## 2014-12-30 MED ORDER — ACETAMINOPHEN 10 MG/ML IV SOLN
1000.0000 mg | Freq: Once | INTRAVENOUS | Status: AC
  Administered 2014-12-31: 1000 mg via INTRAVENOUS
  Filled 2014-12-30: qty 100

## 2014-12-30 MED ORDER — SODIUM CHLORIDE 0.9 % IV SOLN: INTRAVENOUS | Status: DC

## 2014-12-30 MED ORDER — CHLORHEXIDINE GLUCONATE 4 % EX LIQD
60.0000 mL | Freq: Once | CUTANEOUS | Status: DC
Start: 2014-12-30 — End: 2014-12-31
  Filled 2014-12-30: qty 60

## 2014-12-30 MED ORDER — CEFAZOLIN SODIUM-DEXTROSE 2-3 GM-% IV SOLR
2.0000 g | INTRAVENOUS | Status: AC
  Administered 2014-12-31: 2 g via INTRAVENOUS
  Filled 2014-12-30: qty 50

## 2014-12-30 MED ORDER — CHLORHEXIDINE GLUCONATE 4 % EX LIQD
60.0000 mL | Freq: Once | CUTANEOUS | Status: DC
  Filled 2014-12-30: qty 60

## 2014-12-30 NOTE — Progress Notes (Signed)
Pt notified of time change;new arrival time of 0530.Verbalized understanding.

## 2014-12-31 ENCOUNTER — Inpatient Hospital Stay (HOSPITAL_COMMUNITY): Payer: Commercial Managed Care - HMO | Admitting: Vascular Surgery

## 2014-12-31 ENCOUNTER — Encounter (HOSPITAL_COMMUNITY): Payer: Self-pay | Admitting: *Deleted

## 2014-12-31 ENCOUNTER — Inpatient Hospital Stay (HOSPITAL_COMMUNITY): Payer: Commercial Managed Care - HMO | Admitting: Anesthesiology

## 2014-12-31 ENCOUNTER — Inpatient Hospital Stay (HOSPITAL_COMMUNITY)
Admission: RE | Admit: 2014-12-31 | Discharge: 2015-01-03 | DRG: 470 | Disposition: A | Discharge status: Skilled Nursing Facility | Payer: Commercial Managed Care - HMO | Source: Ambulatory Visit | Attending: Orthopaedic Surgery | Admitting: Orthopaedic Surgery

## 2014-12-31 ENCOUNTER — Encounter (HOSPITAL_COMMUNITY)
Admission: RE | Disposition: A | Discharge status: Skilled Nursing Facility | Payer: Self-pay | Source: Ambulatory Visit | Attending: Orthopaedic Surgery

## 2014-12-31 DIAGNOSIS — E119 Type 2 diabetes mellitus without complications: Secondary | ICD-10-CM | POA: Diagnosis not present

## 2014-12-31 DIAGNOSIS — F419 Anxiety disorder, unspecified: Secondary | ICD-10-CM | POA: Diagnosis present

## 2014-12-31 DIAGNOSIS — M170 Bilateral primary osteoarthritis of knee: Principal | ICD-10-CM | POA: Diagnosis present

## 2014-12-31 DIAGNOSIS — M1710 Unilateral primary osteoarthritis, unspecified knee: Secondary | ICD-10-CM | POA: Diagnosis present

## 2014-12-31 DIAGNOSIS — E860 Dehydration: Secondary | ICD-10-CM | POA: Diagnosis present

## 2014-12-31 DIAGNOSIS — N179 Acute kidney failure, unspecified: Secondary | ICD-10-CM | POA: Diagnosis not present

## 2014-12-31 DIAGNOSIS — Z79899 Other long term (current) drug therapy: Secondary | ICD-10-CM | POA: Diagnosis not present

## 2014-12-31 DIAGNOSIS — E538 Deficiency of other specified B group vitamins: Secondary | ICD-10-CM | POA: Diagnosis not present

## 2014-12-31 DIAGNOSIS — N400 Enlarged prostate without lower urinary tract symptoms: Secondary | ICD-10-CM | POA: Diagnosis present

## 2014-12-31 DIAGNOSIS — M1711 Unilateral primary osteoarthritis, right knee: Secondary | ICD-10-CM | POA: Diagnosis not present

## 2014-12-31 DIAGNOSIS — Z794 Long term (current) use of insulin: Secondary | ICD-10-CM | POA: Diagnosis not present

## 2014-12-31 DIAGNOSIS — I4510 Unspecified right bundle-branch block: Secondary | ICD-10-CM | POA: Diagnosis present

## 2014-12-31 DIAGNOSIS — E785 Hyperlipidemia, unspecified: Secondary | ICD-10-CM | POA: Diagnosis not present

## 2014-12-31 DIAGNOSIS — M179 Osteoarthritis of knee, unspecified: Secondary | ICD-10-CM | POA: Diagnosis not present

## 2014-12-31 DIAGNOSIS — T464X5A Adverse effect of angiotensin-converting-enzyme inhibitors, initial encounter: Secondary | ICD-10-CM | POA: Diagnosis present

## 2014-12-31 DIAGNOSIS — I10 Essential (primary) hypertension: Secondary | ICD-10-CM | POA: Diagnosis not present

## 2014-12-31 DIAGNOSIS — F329 Major depressive disorder, single episode, unspecified: Secondary | ICD-10-CM | POA: Diagnosis present

## 2014-12-31 DIAGNOSIS — G8918 Other acute postprocedural pain: Secondary | ICD-10-CM | POA: Diagnosis not present

## 2014-12-31 DIAGNOSIS — Z8249 Family history of ischemic heart disease and other diseases of the circulatory system: Secondary | ICD-10-CM

## 2014-12-31 DIAGNOSIS — Z823 Family history of stroke: Secondary | ICD-10-CM | POA: Diagnosis not present

## 2014-12-31 DIAGNOSIS — Z7902 Long term (current) use of antithrombotics/antiplatelets: Secondary | ICD-10-CM

## 2014-12-31 DIAGNOSIS — E875 Hyperkalemia: Secondary | ICD-10-CM | POA: Diagnosis not present

## 2014-12-31 DIAGNOSIS — M25561 Pain in right knee: Secondary | ICD-10-CM | POA: Diagnosis present

## 2014-12-31 DIAGNOSIS — Z87891 Personal history of nicotine dependence: Secondary | ICD-10-CM

## 2014-12-31 DIAGNOSIS — E118 Type 2 diabetes mellitus with unspecified complications: Secondary | ICD-10-CM | POA: Diagnosis not present

## 2014-12-31 HISTORY — PX: TOTAL KNEE ARTHROPLASTY: SHX125

## 2014-12-31 LAB — GLUCOSE, CAPILLARY
GLUCOSE-CAPILLARY: 167 mg/dL — AB (ref 70–99)
Glucose-Capillary: 126 mg/dL — ABNORMAL HIGH (ref 70–99)
Glucose-Capillary: 155 mg/dL — ABNORMAL HIGH (ref 70–99)
Glucose-Capillary: 163 mg/dL — ABNORMAL HIGH (ref 70–99)
Glucose-Capillary: 199 mg/dL — ABNORMAL HIGH (ref 70–99)

## 2014-12-31 LAB — CBC
HEMATOCRIT: 37.2 % — AB (ref 39.0–52.0)
HEMOGLOBIN: 12.4 g/dL — AB (ref 13.0–17.0)
MCH: 29.3 pg (ref 26.0–34.0)
MCHC: 33.3 g/dL (ref 30.0–36.0)
MCV: 87.9 fL (ref 78.0–100.0)
Platelets: 233 10*3/uL (ref 150–400)
RBC: 4.23 MIL/uL (ref 4.22–5.81)
RDW: 13.1 % (ref 11.5–15.5)
WBC: 11.8 10*3/uL — AB (ref 4.0–10.5)

## 2014-12-31 LAB — CREATININE, SERUM
CREATININE: 1.2 mg/dL (ref 0.50–1.35)
GFR calc Af Amer: 63 mL/min — ABNORMAL LOW (ref >90)
GFR calc non Af Amer: 54 mL/min — ABNORMAL LOW (ref >90)

## 2014-12-31 SURGERY — ARTHROPLASTY, KNEE, TOTAL
Anesthesia: Regional | Site: Knee | Laterality: Right

## 2014-12-31 MED ORDER — LISINOPRIL 20 MG PO TABS
20.0000 mg | ORAL_TABLET | Freq: Every day | ORAL | Status: DC
  Administered 2015-01-01: 20 mg via ORAL
  Filled 2014-12-31: qty 1

## 2014-12-31 MED ORDER — LIDOCAINE HCL (CARDIAC) 20 MG/ML IV SOLN
INTRAVENOUS | Status: DC | PRN
  Administered 2014-12-31: 50 mg via INTRAVENOUS

## 2014-12-31 MED ORDER — ROCURONIUM BROMIDE 100 MG/10ML IV SOLN
INTRAVENOUS | Status: DC | PRN
  Administered 2014-12-31: 50 mg via INTRAVENOUS

## 2014-12-31 MED ORDER — ALUM & MAG HYDROXIDE-SIMETH 200-200-20 MG/5ML PO SUSP: 30.0000 mL | ORAL | Status: DC | PRN

## 2014-12-31 MED ORDER — KETOROLAC TROMETHAMINE 15 MG/ML IJ SOLN
7.5000 mg | Freq: Four times a day (QID) | INTRAMUSCULAR | Status: AC
  Administered 2014-12-31 – 2015-01-01 (×4): 7.5 mg via INTRAVENOUS
  Filled 2014-12-31 (×4): qty 1

## 2014-12-31 MED ORDER — BISACODYL 10 MG RE SUPP: 10.0000 mg | Freq: Every day | RECTAL | Status: DC | PRN

## 2014-12-31 MED ORDER — OXYCODONE HCL 5 MG/5ML PO SOLN
5.0000 mg | Freq: Once | ORAL | Status: DC | PRN
Start: 2014-12-31 — End: 2014-12-31

## 2014-12-31 MED ORDER — FENTANYL CITRATE (PF) 250 MCG/5ML IJ SOLN
INTRAMUSCULAR | Status: AC
  Filled 2014-12-31: qty 5

## 2014-12-31 MED ORDER — INSULIN DETEMIR 100 UNIT/ML FLEXPEN: 25.0000 [IU] | PEN_INJECTOR | Freq: Every day | SUBCUTANEOUS | Status: DC

## 2014-12-31 MED ORDER — BUPIVACAINE-EPINEPHRINE (PF) 0.25% -1:200000 IJ SOLN
INTRAMUSCULAR | Status: AC
  Filled 2014-12-31: qty 30

## 2014-12-31 MED ORDER — LACTATED RINGERS IV SOLN
INTRAVENOUS | Status: DC | PRN
  Administered 2014-12-31 (×2): via INTRAVENOUS

## 2014-12-31 MED ORDER — ONDANSETRON HCL 4 MG PO TABS: 4.0000 mg | ORAL_TABLET | Freq: Four times a day (QID) | ORAL | Status: DC | PRN

## 2014-12-31 MED ORDER — BUPIVACAINE-EPINEPHRINE (PF) 0.5% -1:200000 IJ SOLN
INTRAMUSCULAR | Status: DC | PRN
  Administered 2014-12-31: 30 mL via PERINEURAL

## 2014-12-31 MED ORDER — ATORVASTATIN CALCIUM 10 MG PO TABS
10.0000 mg | ORAL_TABLET | Freq: Every day | ORAL | Status: DC
  Administered 2014-12-31 – 2015-01-03 (×4): 10 mg via ORAL
  Filled 2014-12-31 (×4): qty 1

## 2014-12-31 MED ORDER — DEXMEDETOMIDINE HCL 200 MCG/2ML IV SOLN
INTRAVENOUS | Status: DC | PRN
  Administered 2014-12-31: 16 ug via INTRAVENOUS

## 2014-12-31 MED ORDER — OXYCODONE HCL 5 MG PO TABS
5.0000 mg | ORAL_TABLET | ORAL | Status: DC | PRN
  Administered 2015-01-02 – 2015-01-03 (×3): 10 mg via ORAL
  Filled 2014-12-31 (×5): qty 2

## 2014-12-31 MED ORDER — RIVAROXABAN 10 MG PO TABS
10.0000 mg | ORAL_TABLET | Freq: Every day | ORAL | Status: DC
  Administered 2015-01-01 – 2015-01-03 (×3): 10 mg via ORAL
  Filled 2014-12-31 (×3): qty 1

## 2014-12-31 MED ORDER — ONDANSETRON HCL 4 MG/2ML IJ SOLN: 4.0000 mg | Freq: Four times a day (QID) | INTRAMUSCULAR | Status: DC | PRN

## 2014-12-31 MED ORDER — DOCUSATE SODIUM 100 MG PO CAPS
100.0000 mg | ORAL_CAPSULE | Freq: Two times a day (BID) | ORAL | Status: DC
  Administered 2014-12-31 – 2015-01-01 (×2): 100 mg via ORAL
  Filled 2014-12-31 (×2): qty 1

## 2014-12-31 MED ORDER — ACETAMINOPHEN 10 MG/ML IV SOLN
1000.0000 mg | Freq: Four times a day (QID) | INTRAVENOUS | Status: AC
  Administered 2014-12-31 – 2015-01-01 (×4): 1000 mg via INTRAVENOUS
  Filled 2014-12-31 (×4): qty 100

## 2014-12-31 MED ORDER — FENTANYL CITRATE (PF) 100 MCG/2ML IJ SOLN
INTRAMUSCULAR | Status: AC
  Filled 2014-12-31: qty 2

## 2014-12-31 MED ORDER — HYDROMORPHONE HCL 1 MG/ML IJ SOLN
0.5000 mg | INTRAMUSCULAR | Status: DC | PRN
  Administered 2014-12-31 (×2): 1 mg via INTRAVENOUS
  Filled 2014-12-31 (×2): qty 1

## 2014-12-31 MED ORDER — SODIUM CHLORIDE 0.9 % IV SOLN
INTRAVENOUS | Status: DC
  Administered 2014-12-31: 75 mL via INTRAVENOUS
  Administered 2015-01-01: 04:00:00 via INTRAVENOUS

## 2014-12-31 MED ORDER — HEPARIN SODIUM (PORCINE) 5000 UNIT/ML IJ SOLN: 5000.0000 [IU] | Freq: Three times a day (TID) | INTRAMUSCULAR | Status: DC

## 2014-12-31 MED ORDER — ALPRAZOLAM 0.25 MG PO TABS
0.1250 mg | ORAL_TABLET | Freq: Every evening | ORAL | Status: DC | PRN
  Administered 2015-01-01: 0.125 mg via ORAL
  Filled 2014-12-31: qty 1

## 2014-12-31 MED ORDER — INSULIN ASPART 100 UNIT/ML ~~LOC~~ SOLN
0.0000 [IU] | Freq: Three times a day (TID) | SUBCUTANEOUS | Status: DC
  Administered 2014-12-31 (×2): 3 [IU] via SUBCUTANEOUS
  Administered 2015-01-01: 5 [IU] via SUBCUTANEOUS
  Administered 2015-01-02: 2 [IU] via SUBCUTANEOUS
  Administered 2015-01-02 (×2): 3 [IU] via SUBCUTANEOUS
  Administered 2015-01-03 (×3): 2 [IU] via SUBCUTANEOUS

## 2014-12-31 MED ORDER — LABETALOL HCL 5 MG/ML IV SOLN
INTRAVENOUS | Status: DC | PRN
  Administered 2014-12-31: 7.5 mg via INTRAVENOUS

## 2014-12-31 MED ORDER — MENTHOL 3 MG MT LOZG: 1.0000 | LOZENGE | OROMUCOSAL | Status: DC | PRN

## 2014-12-31 MED ORDER — METOCLOPRAMIDE HCL 5 MG PO TABS: 5.0000 mg | ORAL_TABLET | Freq: Three times a day (TID) | ORAL | Status: DC | PRN

## 2014-12-31 MED ORDER — GLIPIZIDE 5 MG PO TABS
5.0000 mg | ORAL_TABLET | Freq: Two times a day (BID) | ORAL | Status: DC
  Administered 2014-12-31 – 2015-01-03 (×7): 5 mg via ORAL
  Filled 2014-12-31 (×7): qty 1

## 2014-12-31 MED ORDER — INSULIN DETEMIR 100 UNIT/ML ~~LOC~~ SOLN
25.0000 [IU] | Freq: Every day | SUBCUTANEOUS | Status: DC
  Administered 2014-12-31 – 2015-01-02 (×3): 25 [IU] via SUBCUTANEOUS
  Filled 2014-12-31 (×4): qty 0.25

## 2014-12-31 MED ORDER — BUPIVACAINE-EPINEPHRINE 0.25% -1:200000 IJ SOLN
INTRAMUSCULAR | Status: DC | PRN
  Administered 2014-12-31: 30 mL

## 2014-12-31 MED ORDER — GLYCOPYRROLATE 0.2 MG/ML IJ SOLN
INTRAMUSCULAR | Status: DC | PRN
  Administered 2014-12-31: .4 mg via INTRAVENOUS

## 2014-12-31 MED ORDER — DIPHENHYDRAMINE HCL 12.5 MG/5ML PO ELIX: 12.5000 mg | ORAL_SOLUTION | ORAL | Status: DC | PRN

## 2014-12-31 MED ORDER — METOCLOPRAMIDE HCL 5 MG/ML IJ SOLN
5.0000 mg | Freq: Three times a day (TID) | INTRAMUSCULAR | Status: DC | PRN
  Administered 2014-12-31: 10 mg via INTRAVENOUS
  Filled 2014-12-31: qty 2

## 2014-12-31 MED ORDER — FENTANYL CITRATE (PF) 100 MCG/2ML IJ SOLN
25.0000 ug | INTRAMUSCULAR | Status: DC | PRN
  Administered 2014-12-31 (×2): 25 ug via INTRAVENOUS

## 2014-12-31 MED ORDER — NEOSTIGMINE METHYLSULFATE 10 MG/10ML IV SOLN
INTRAVENOUS | Status: DC | PRN
  Administered 2014-12-31: 3 mg via INTRAVENOUS

## 2014-12-31 MED ORDER — SODIUM CHLORIDE 0.9 % IR SOLN
Status: DC | PRN
  Administered 2014-12-31: 1000 mL

## 2014-12-31 MED ORDER — DEXTROSE 5 % IV SOLN
500.0000 mg | Freq: Four times a day (QID) | INTRAVENOUS | Status: DC | PRN
  Filled 2014-12-31: qty 5

## 2014-12-31 MED ORDER — LABETALOL HCL 5 MG/ML IV SOLN
INTRAVENOUS | Status: AC
  Filled 2014-12-31: qty 4

## 2014-12-31 MED ORDER — MAGNESIUM CITRATE PO SOLN: 1.0000 | Freq: Once | ORAL | Status: AC | PRN

## 2014-12-31 MED ORDER — PROPOFOL 10 MG/ML IV BOLUS
INTRAVENOUS | Status: DC | PRN
  Administered 2014-12-31 (×2): 20 mg via INTRAVENOUS
  Administered 2014-12-31: 150 mg via INTRAVENOUS

## 2014-12-31 MED ORDER — PROPOFOL 10 MG/ML IV BOLUS
INTRAVENOUS | Status: AC
  Filled 2014-12-31: qty 20

## 2014-12-31 MED ORDER — ONDANSETRON HCL 4 MG/2ML IJ SOLN
INTRAMUSCULAR | Status: DC | PRN
  Administered 2014-12-31: 4 mg via INTRAVENOUS

## 2014-12-31 MED ORDER — FENOFIBRATE 160 MG PO TABS
160.0000 mg | ORAL_TABLET | Freq: Every day | ORAL | Status: DC
  Administered 2014-12-31 – 2015-01-03 (×4): 160 mg via ORAL
  Filled 2014-12-31 (×4): qty 1

## 2014-12-31 MED ORDER — OXYCODONE HCL 5 MG PO TABS: 5.0000 mg | ORAL_TABLET | Freq: Once | ORAL | Status: DC | PRN

## 2014-12-31 MED ORDER — PHENOL 1.4 % MT LIQD: 1.0000 | OROMUCOSAL | Status: DC | PRN

## 2014-12-31 MED ORDER — METHOCARBAMOL 500 MG PO TABS
500.0000 mg | ORAL_TABLET | Freq: Four times a day (QID) | ORAL | Status: DC | PRN
  Filled 2014-12-31: qty 1

## 2014-12-31 MED ORDER — ONDANSETRON HCL 4 MG/2ML IJ SOLN
4.0000 mg | Freq: Four times a day (QID) | INTRAMUSCULAR | Status: DC | PRN
  Administered 2014-12-31: 4 mg via INTRAVENOUS
  Filled 2014-12-31: qty 2

## 2014-12-31 MED ORDER — FENTANYL CITRATE (PF) 100 MCG/2ML IJ SOLN
INTRAMUSCULAR | Status: DC | PRN
  Administered 2014-12-31 (×2): 100 ug via INTRAVENOUS
  Administered 2014-12-31: 50 ug via INTRAVENOUS

## 2014-12-31 MED ORDER — POLYETHYLENE GLYCOL 3350 17 G PO PACK: 17.0000 g | PACK | Freq: Every day | ORAL | Status: DC | PRN

## 2014-12-31 MED ORDER — CEFAZOLIN SODIUM-DEXTROSE 2-3 GM-% IV SOLR
2.0000 g | Freq: Four times a day (QID) | INTRAVENOUS | Status: AC
  Administered 2014-12-31 (×2): 2 g via INTRAVENOUS
  Filled 2014-12-31 (×2): qty 50

## 2014-12-31 SURGICAL SUPPLY — 62 items
BANDAGE ESMARK 6X9 LF (GAUZE/BANDAGES/DRESSINGS) ×1 IMPLANT
BLADE SAGITTAL 25.0X1.19X90 (BLADE) ×2 IMPLANT
BLADE SAGITTAL 25.0X1.19X90MM (BLADE) ×1
BNDG CMPR 9X6 STRL LF SNTH (GAUZE/BANDAGES/DRESSINGS) ×1
BNDG ESMARK 6X9 LF (GAUZE/BANDAGES/DRESSINGS) ×3
BOWL SMART MIX CTS (DISPOSABLE) ×3 IMPLANT
CAP KNEE TOTAL 3 SIGMA ×2 IMPLANT
CEMENT HV SMART SET (Cement) ×6 IMPLANT
COVER SURGICAL LIGHT HANDLE (MISCELLANEOUS) ×3 IMPLANT
CUFF TOURNIQUET SINGLE 34IN LL (TOURNIQUET CUFF) ×2 IMPLANT
CUFF TOURNIQUET SINGLE 44IN (TOURNIQUET CUFF) IMPLANT
DRAPE EXTREMITY T 121X128X90 (DRAPE) ×3 IMPLANT
DRAPE IMP U-DRAPE 54X76 (DRAPES) ×3 IMPLANT
DRAPE PROXIMA HALF (DRAPES) ×3 IMPLANT
DRSG ADAPTIC 3X8 NADH LF (GAUZE/BANDAGES/DRESSINGS) ×3 IMPLANT
DRSG PAD ABDOMINAL 8X10 ST (GAUZE/BANDAGES/DRESSINGS) ×4 IMPLANT
DURAPREP 26ML APPLICATOR (WOUND CARE) ×6 IMPLANT
ELECT CAUTERY BLADE 6.4 (BLADE) ×3 IMPLANT
ELECT REM PT RETURN 9FT ADLT (ELECTROSURGICAL) ×3
ELECTRODE REM PT RTRN 9FT ADLT (ELECTROSURGICAL) ×1 IMPLANT
EVACUATOR 1/8 PVC DRAIN (DRAIN) IMPLANT
FACESHIELD WRAPAROUND (MASK) ×6 IMPLANT
FACESHIELD WRAPAROUND OR TEAM (MASK) ×2 IMPLANT
FLOSEAL 10ML (HEMOSTASIS) IMPLANT
GAUZE SPONGE 4X4 12PLY STRL (GAUZE/BANDAGES/DRESSINGS) ×3 IMPLANT
GLOVE BIOGEL PI IND STRL 8 (GLOVE) ×1 IMPLANT
GLOVE BIOGEL PI IND STRL 8.5 (GLOVE) ×1 IMPLANT
GLOVE BIOGEL PI INDICATOR 8 (GLOVE) ×2
GLOVE BIOGEL PI INDICATOR 8.5 (GLOVE) ×2
GLOVE ECLIPSE 8.0 STRL XLNG CF (GLOVE) ×6 IMPLANT
GLOVE SURG ORTHO 8.5 STRL (GLOVE) ×6 IMPLANT
GOWN STRL REUS W/ TWL LRG LVL3 (GOWN DISPOSABLE) ×2 IMPLANT
GOWN STRL REUS W/TWL 2XL LVL3 (GOWN DISPOSABLE) ×3 IMPLANT
GOWN STRL REUS W/TWL LRG LVL3 (GOWN DISPOSABLE) ×6
HANDPIECE INTERPULSE COAX TIP (DISPOSABLE) ×3
KIT BASIN OR (CUSTOM PROCEDURE TRAY) ×3 IMPLANT
KIT ROOM TURNOVER OR (KITS) ×3 IMPLANT
MANIFOLD NEPTUNE II (INSTRUMENTS) ×3 IMPLANT
NEEDLE 22X1 1/2 (OR ONLY) (NEEDLE) ×2 IMPLANT
NS IRRIG 1000ML POUR BTL (IV SOLUTION) ×3 IMPLANT
PACK TOTAL JOINT (CUSTOM PROCEDURE TRAY) ×3 IMPLANT
PACK UNIVERSAL I (CUSTOM PROCEDURE TRAY) ×3 IMPLANT
PAD ARMBOARD 7.5X6 YLW CONV (MISCELLANEOUS) ×6 IMPLANT
PAD CAST 4YDX4 CTTN HI CHSV (CAST SUPPLIES) ×1 IMPLANT
PADDING CAST COTTON 4X4 STRL (CAST SUPPLIES) ×3
PADDING CAST COTTON 6X4 STRL (CAST SUPPLIES) ×3 IMPLANT
SET HNDPC FAN SPRY TIP SCT (DISPOSABLE) ×1 IMPLANT
STAPLER VISISTAT 35W (STAPLE) ×3 IMPLANT
SUCTION FRAZIER TIP 10 FR DISP (SUCTIONS) ×3 IMPLANT
SUT BONE WAX W31G (SUTURE) ×3 IMPLANT
SUT ETHIBOND NAB CT1 #1 30IN (SUTURE) ×9 IMPLANT
SUT MNCRL AB 3-0 PS2 18 (SUTURE) ×3 IMPLANT
SUT VIC AB 0 CT1 27 (SUTURE) ×3
SUT VIC AB 0 CT1 27XBRD ANBCTR (SUTURE) ×1 IMPLANT
SUT VIC AB 1 CT1 27 (SUTURE) ×3
SUT VIC AB 1 CT1 27XBRD ANBCTR (SUTURE) ×1 IMPLANT
SYR CONTROL 10ML LL (SYRINGE) ×2 IMPLANT
TOWEL OR 17X24 6PK STRL BLUE (TOWEL DISPOSABLE) ×3 IMPLANT
TOWEL OR 17X26 10 PK STRL BLUE (TOWEL DISPOSABLE) ×3 IMPLANT
TRAY FOLEY CATH 16FRSI W/METER (SET/KITS/TRAYS/PACK) ×2 IMPLANT
WATER STERILE IRR 1000ML POUR (IV SOLUTION) ×6 IMPLANT
WRAP KNEE MAXI GEL POST OP (GAUZE/BANDAGES/DRESSINGS) ×3 IMPLANT

## 2014-12-31 NOTE — Anesthesia Preprocedure Evaluation (Signed)
Anesthesia Evaluation  Patient identified by MRN, date of birth, ID band Patient awake    Reviewed: Allergy & Precautions, NPO status , Patient's Chart, lab work & pertinent test results  Airway Mallampati: II   Neck ROM: full    Dental   Pulmonary former smoker,  breath sounds clear to auscultation        Cardiovascular hypertension, Rhythm:regular Rate:Normal     Neuro/Psych Seizures -,  Anxiety Depression    GI/Hepatic   Endo/Other  diabetes, Type 2  Renal/GU      Musculoskeletal  (+) Arthritis -,   Abdominal   Peds  Hematology   Anesthesia Other Findings   Reproductive/Obstetrics                             Anesthesia Physical Anesthesia Plan  ASA: II  Anesthesia Plan: General and Regional   Post-op Pain Management: MAC Combined w/ Regional for Post-op pain   Induction: Intravenous  Airway Management Planned: Oral ETT  Additional Equipment:   Intra-op Plan:   Post-operative Plan: Extubation in OR  Informed Consent: I have reviewed the patients History and Physical, chart, labs and discussed the procedure including the risks, benefits and alternatives for the proposed anesthesia with the patient or authorized representative who has indicated his/her understanding and acceptance.     Plan Discussed with: CRNA, Surgeon and Anesthesiologist  Anesthesia Plan Comments:         Anesthesia Quick Evaluation

## 2014-12-31 NOTE — Progress Notes (Signed)
Orthopedic Tech Progress Note Patient Details:  Richard Randolph 10-Jul-1931 [id number] On cpm at 7:10 pm [name], male   DOB: 06/20/1931, 81 y.o.   MRN: [id number]   Richard Randolph 12/31/2014, 7:11 PM

## 2014-12-31 NOTE — Op Note (Signed)
NAMEDALEY, GOSSE                  ACCOUNT NO.:  [id number]  MEDICAL RECORD NO.:  [id number]  LOCATION:  [facility]                         FACILITY:  [facility]  PHYSICIAN:  Vonna Kotyk. Finn Amos, M.D.DATE OF BIRTH:  Feb 03, 1931  DATE OF PROCEDURE:  12/31/2014 DATE OF DISCHARGE:                              OPERATIVE REPORT   PREOPERATIVE DIAGNOSIS:  End-stage primary osteoarthritis, right knee.  POSTOPERATIVE DIAGNOSIS:  End-stage primary osteoarthritis, right knee.  PROCEDURE:  Right total knee replacement.  SURGEON:  Joni Fears, MD.  ASSISTANT:  Biagio Borg, PA-C, who was present throughout the operative procedure to ensure its timely completion.  ANESTHESIA:  Femoral nerve block with general.  COMPLICATIONS:  None.  COMPONENTS:  DePuy LCS standard plus femoral component, #4 rotating keeled tibial tray, 10-mm polyethylene bridging bearing, 3 peg metal back rotating patella.  Components were secured with polymethyl methacrylate.  PROCEDURE IN DETAIL:  Mr. Pultz was met in the holding area.  We identified the right knee as appropriate operative site and marked it accordingly.  He received preoperative femoral nerve block per Anesthesia.  The patient was then transported to room #7 and placed under general anesthesia without difficulty.  The patient has history of prostate hypertrophy.  A Foley catheter was inserted without difficulty.  Urine was clear.  Right lower extremity was then placed in a thigh tourniquet.  The leg was then prepped with chlorhexidine scrub and DuraPrep from the tourniquet to the tips of the toes.  Sterile draping was performed. Time-out was called.  With the right lower extremity elevated, it was Esmarch exsanguinated with a proximal tourniquet at 350 mmHg.  A midline longitudinal incision was made, centered about the patella extending from a superior pouch to the tibial tubercle.  Via sharp dissection, incision was carried down the subcutaneous  tissue.  Any small bleeders were Bovie coagulated.  First layer of capsule was incised in midline and medial parapatellar incision was made through the deep capsule using the Bovie.  The joint was entered.  There was minimal clear yellow joint effusion.  Patella was everted 180 degrees laterally and the knee flexed to 90 degrees.  There were large osteophytes along the medial and lateral femoral condyles and along the medial tibial plateau.  There was a fixed varus position.  I did perform a medial release subperiosteally with a Cobb elevator.  I removed the osteophytes from about the femur and then measured the standard plus femoral component.  First, bony cut was made transversely in the proximal tibia with a 7- degree angle of declination.  With each bony cut on the tibia and femur, I checked my external alignment with the external guide.  Subsequent cuts were then made on the femur using the standard plus femoral jig.  I used a 4-degree distal femoral valgus cut.  Flexion and extension gaps were perfectly symmetrical at 10 mm.  A lamina spreader was inserted in the medial and lateral compartments. I removed medial and lateral menisci, ACL and PCL and osteophytes in the posterior femoral condyle medially and laterally.  I did not encounter any loose bodies.  MCL and LCL remained intact throughout the procedure.  Final cut  for tapering purpose was made on the femur.  Retractors were then placed around the tibia measured a #4 tibial tray. This was pinned in place.  A center hole was made followed by the keeled cut with the tibial jig in place, a 10-mm polyethylene bridging with bearing was inserted, followed by the standard plus trial femoral component.  The joint was reduced through a full range of motion.  We had full extension, flexion over 125 degrees without instability with very nice alignment.  We checked the alignment again and that appeared to me anatomic.  I did replace  the patella.  I removed 11 mm of bone leaving approximately 14.5 mm of thickness.  The patella jig was applied, three holes were made.  The trial patella inserted and then reduced through the full range of motion.  The trial components remained intact.  Trial components removed.  The joint was copiously irrigated with saline solution.  The final components were then impacted with polymethyl methacrylate. We initially inserted the tip of the #4 tibial tray followed by the 10- mm polyethylene bridging bearing and then the standard plus femoral component.  With the knee in extension, the components were nicely seated.  Extraneous methacrylate was removed from the periphery of the components.  Patella was applied with the methacrylate and a patellar clamp.  At approximately 16 minutes of methacrylate had matured, during which time, we injected 0.25% Marcaine with epinephrine into the joint deep to the capsule.  A tourniquet was deflated at 58 minutes.  We had nice capillary refill to the joint surface.  Any gross bleeders were Bovie coagulated.  Bleeding bone was controlled with bone wax.  The joint was again irrigated with saline solution.  Medium-size Hemovac was placed to the lateral compartment.  The deep capsule was then closed with #1 Ethibond, superficial capsule with a running 0 Vicryl, subcu with 3-0 Monocryl.  Skin closed with skin clips.  Sterile bulky dressing was applied followed by the patient's support stocking.  The patient tolerated the procedure without complications and returned to the Postanesthesia Recovery Room in good condition.     Vonna Kotyk. Durward Fortes, M.D.     PWW/MEDQ  D:  12/31/2014  T:  12/31/2014  Job:  [id number]

## 2014-12-31 NOTE — Progress Notes (Signed)
Orthopedic Tech Progress Note Patient Details:  Richard Randolph 1931/07/26 [id number]  CPM Right Knee CPM Right Knee: On Right Knee Flexion (Degrees): 90 Right Knee Extension (Degrees): 0 Additional Comments: trapeze bar patient helper Viewed order from doctor's order list  Hildred Priest 12/31/2014, 10:15 AM

## 2014-12-31 NOTE — Anesthesia Procedure Notes (Signed)
Anesthesia Regional Block:  Femoral nerve block  Pre-Anesthetic Checklist: ,, timeout performed, Correct Patient, Correct Site, Correct Laterality, Correct Procedure,, site marked, risks and benefits discussed, Surgical consent,  Pre-op evaluation,  At surgeon's request and post-op pain management  Laterality: Right  Prep: chloraprep       Needles:  Injection technique: Single-shot  Needle Type: Echogenic Stimulator Needle     Needle Length: 9cm 9 cm Needle Gauge: 21 and 21 G    Additional Needles:  Procedures: nerve stimulator Femoral nerve block  Nerve Stimulator or Paresthesia:  Response: Quadriceps muscle contraction, 0.45 mA,   Additional Responses:   Narrative:  Start time: 12/31/2014 7:04 AM End time: 12/31/2014 7:13 AM Injection made incrementally with aspirations every 5 mL.  Performed by: Personally  Anesthesiologist: Lakeia Bradshaw  Additional Notes: Functioning IV was confirmed and monitors were applied.  A 26mm 21ga Arrow echogenic stimulator needle was used. Sterile prep and drape,hand hygiene and sterile gloves were used.  Negative aspiration and negative test dose prior to incremental administration of local anesthetic. The patient tolerated the procedure well.

## 2014-12-31 NOTE — Transfer of Care (Signed)
Immediate Anesthesia Transfer of Care Note  Patient: Richard Randolph  Procedure(s) Performed: Procedure(s): TOTAL KNEE ARTHROPLASTY (Right)  Patient Location: PACU  Anesthesia Type:General and Regional  Level of Consciousness: awake, alert  and oriented  Airway & Oxygen Therapy: Patient Spontanous Breathing and Patient connected to nasal cannula oxygen  Post-op Assessment: Report given to RN and Post -op Vital signs reviewed and stable  Post vital signs: Reviewed and stable  Last Vitals:  Filed Vitals:   12/31/14 0638  BP: 126/56  Pulse: 66  Temp: 36.4 C  Resp: 20    Complications: No apparent anesthesia complications

## 2014-12-31 NOTE — Progress Notes (Signed)
Orthopedic Tech Progress Note Patient Details:  Richard Randolph [birth year] [id number]  Ortho Devices Type of Ortho Device: Knee Immobilizer Ortho Device/Splint Location: RLE Ortho Device/Splint Interventions: [name] 12/31/2014, 7:10 PM

## 2014-12-31 NOTE — Progress Notes (Signed)
Utilization review completed.  

## 2014-12-31 NOTE — Evaluation (Signed)
Physical Therapy Evaluation Patient Details Name: Richard Randolph MRN: [id number] DOB: 1931/05/30 Today's Date: 12/31/2014   History of Present Illness  Pt is a 81 y/o M s/p R TKA.  Pt's PMH includes HOH, seizures as a child, type II DM, HTN, degenerative cervical disc, lumbar disc narrowing, anxiety, hyperkalemia, BPPV, and depression.  Clinical Impression  Pt is s/p R TKA resulting in the deficits listed below (see PT Problem List).  Pt w/ severe R knee buckling w/ standing upright at EOB so gait training was not attempted this session.  Pt may benefit from [place] at next session if strength in R knee extension does not improve.  MD has been notified regarding potential need for KI order.  Pt will have 24/7 assist from daughters upon d/c for 3 days, and intermittent assist from daughters after 3 days.  Pt will benefit from skilled PT to increase their independence and safety with mobility to allow discharge to the venue listed below.      Follow Up Recommendations Home health PT;Supervision for mobility/OOB    Equipment Recommendations  3in1 (PT)    Recommendations for Other Services       Precautions / Restrictions Precautions Precautions: Knee;Fall Precaution Booklet Issued: Yes (comment) Precaution Comments: Reviewed no pillow under knee Required Braces or Orthoses:  (Recommending order of KI) Restrictions Weight Bearing Restrictions: Yes RLE Weight Bearing: Partial weight bearing RLE Partial Weight Bearing Percentage or Pounds: 50      Mobility  Bed Mobility Overal bed mobility: Modified Independent             General bed mobility comments: use of rails and verbal cues for proper sequencing, increased time  Transfers Overall transfer level: Needs assistance Equipment used: Rolling walker (2 wheeled) Transfers: Sit to/from Stand Sit to Stand: Mod assist;From elevated surface         General transfer comment: Cues for hand placement.  Mod A to maintain balance  standing EOB 2/2 pt's severe R knee buckling  Ambulation/Gait             General Gait Details: did not attempt this session 2/2 severe R knee buckling  Stairs            Wheelchair Mobility    Modified Rankin (Stroke Patients Only)       Balance Overall balance assessment: Needs assistance Sitting-balance support: No upper extremity supported;Feet supported Sitting balance-Leahy Scale: Good     Standing balance support: Bilateral upper extremity supported;During functional activity Standing balance-Leahy Scale: Poor                               Pertinent Vitals/Pain Pain Assessment: 0-10 Pain Score: 6  Pain Location: R knee Pain Descriptors / Indicators: Heaviness;Grimacing;Dull;Discomfort Pain Intervention(s): Limited activity within patient's tolerance;Monitored during session;Repositioned    Home Living Family/patient expects to be discharged to:: Private residence Living Arrangements: Alone;Children (Children will be avail 24/7 for first 3 days) Available Help at Discharge: Family;Available 24 hours/day;Available PRN/intermittently (24/7 for 3 days, intermittent after this) Type of Home: House Home Access: Ramped entrance     Home Layout: One level Home Equipment: Walker - 2 wheels;Cane - single point      Prior Function Level of Independence: Independent with assistive device(s) (cane at all times)               Hand Dominance        Extremity/Trunk Assessment  Lower Extremity Assessment: RLE deficits/detail RLE Deficits / Details: 2/5 MMT R knee extension.  RLE MMT 5/5 DF/PF and 4/5 R hip flexion    Cervical / Trunk Assessment: Kyphotic  Communication   Communication: HOH  Cognition Arousal/Alertness: Awake/alert Behavior During Therapy: WFL for tasks assessed/performed Overall Cognitive Status: Within Functional Limits for tasks assessed                      General Comments General  comments (skin integrity, edema, etc.): Pt may benefit from [place] at next session if pt's R knee extension MMT does not improve.      Exercises Total Joint Exercises Ankle Circles/Pumps: AROM;Both;10 reps;Supine Quad Sets: AAROM;AROM;Both;10 reps;Supine Short Arc Quad: AAROM;Right;5 reps;Supine Heel Slides: AROM;Right;10 reps;Supine Knee Flexion: AROM;Right;10 reps;Seated Goniometric ROM: 10-87      Assessment/Plan    PT Assessment Patient needs continued PT services  PT Diagnosis Difficulty walking;Abnormality of gait;Generalized weakness;Acute pain   PT Problem List Decreased strength;Decreased range of motion;Decreased activity tolerance;Decreased balance;Decreased mobility;Decreased coordination;Decreased knowledge of use of DME;Decreased safety awareness;Decreased knowledge of precautions;Pain  PT Treatment Interventions DME instruction;Gait training;Functional mobility training;Therapeutic activities;Therapeutic exercise;Balance training;Neuromuscular re-education;Patient/family education;Modalities   PT Goals (Current goals can be found in the Care Plan section) Acute Rehab PT Goals Patient Stated Goal: to get stronger PT Goal Formulation: With patient/family Time For Goal Achievement: 01/07/15 Potential to Achieve Goals: Good    Frequency 7X/week   Barriers to discharge Decreased caregiver support Intermittent assist from family after 3 days following d/c    Co-evaluation               End of Session Equipment Utilized During Treatment: Gait belt Activity Tolerance: Patient tolerated treatment well;Patient limited by fatigue Patient left: in bed;with call bell/phone within reach;with family/visitor present Nurse Communication: Mobility status;Weight bearing status;Precautions (severe R knee buckling)         Time: 9021-1155 PT Time Calculation (min) (ACUTE ONLY): 34 min   Charges:   PT Evaluation $Initial PT Evaluation Tier I: 1 Procedure PT  Treatments $Therapeutic Exercise: 8-22 mins   PT G Codes:       Joslyn Hy PT, DPT [phone] Pager: [phone] 12/31/2014, 4:05 PM

## 2014-12-31 NOTE — Op Note (Signed)
PATIENT ID:      Richard Randolph  MRN:     [id number] DOB/AGE:    79-Aug-1932 / 79 y.o.       OPERATIVE REPORT    DATE OF PROCEDURE:  12/31/2014       PREOPERATIVE DIAGNOSIS:   END STAGE PRIMARY OSTEOARTHRITIS OF RIGHT KNEE                                                       Estimated body mass index is 27.03 kg/(m^2) as calculated from the following:   Height as of this encounter: 5\' 8"  (1.727 m).   Weight as of this encounter: 80.604 kg (177 lb 11.2 oz).     POSTOPERATIVE DIAGNOSIS:   END STAGE PRIMARY OSTEOARTHRITIS OF RIGHT KNEE                                                                     Estimated body mass index is 27.03 kg/(m^2) as calculated from the following:   Height as of this encounter: 5\' 8"  (1.727 m).   Weight as of this encounter: 80.604 kg (177 lb 11.2 oz).     PROCEDURE:  Procedure(s):RIGHT TOTAL KNEE ARTHROPLASTY     SURGEON:  Joni Fears, MD    ASSISTANT:   Biagio Borg, PA-C   (Present and scrubbed throughout the case, critical for assistance with exposure, retraction, instrumentation, and closure.)          ANESTHESIA: regional and general     DRAINS: (RIGHT KNEE) Hemovact drain(s) in the CLAMPED with  Suction Clamped :      TOURNIQUET TIME:  Total Tourniquet Time Documented: Thigh (Right) - 58 minutes Total: Thigh (Right) - 58 minutes     COMPLICATIONS:  None   CONDITION:  stable  PROCEDURE IN DETAIL: [id number]   Richard Randolph 12/31/2014, 9:11 AM

## 2014-12-31 NOTE — H&P (Signed)
  The recent History & Physical has been reviewed. I have personally examined the patient today. There is no interval change to the documented History & Physical. The patient would like to proceed with the procedure.  Joni Fears W 12/31/2014,  7:12 AM

## 2014-12-31 NOTE — Anesthesia Postprocedure Evaluation (Signed)
Anesthesia Post Note  Patient: Richard Randolph  Procedure(s) Performed: Procedure(s) (LRB): TOTAL KNEE ARTHROPLASTY (Right)  Anesthesia type: General  Patient location: PACU  Post pain: Pain level controlled and Adequate analgesia  Post assessment: Post-op Vital signs reviewed, Patient's Cardiovascular Status Stable, Respiratory Function Stable, Patent Airway and Pain level controlled  Last Vitals:  Filed Vitals:   12/31/14 1015  BP: 151/60  Pulse: 45  Temp:   Resp: 10    Post vital signs: Reviewed and stable  Level of consciousness: awake, alert  and oriented  Complications: No apparent anesthesia complications

## 2015-01-01 ENCOUNTER — Encounter (HOSPITAL_COMMUNITY): Payer: Self-pay | Admitting: Orthopaedic Surgery

## 2015-01-01 DIAGNOSIS — I4510 Unspecified right bundle-branch block: Secondary | ICD-10-CM

## 2015-01-01 DIAGNOSIS — I10 Essential (primary) hypertension: Secondary | ICD-10-CM

## 2015-01-01 DIAGNOSIS — E875 Hyperkalemia: Secondary | ICD-10-CM

## 2015-01-01 DIAGNOSIS — M1711 Unilateral primary osteoarthritis, right knee: Secondary | ICD-10-CM

## 2015-01-01 DIAGNOSIS — J9600 Acute respiratory failure, unspecified whether with hypoxia or hypercapnia: Secondary | ICD-10-CM

## 2015-01-01 LAB — BASIC METABOLIC PANEL
ANION GAP: 7 (ref 5–15)
ANION GAP: 8 (ref 5–15)
BUN: 21 mg/dL (ref 6–23)
BUN: 22 mg/dL (ref 6–23)
CALCIUM: 8.6 mg/dL (ref 8.4–10.5)
CALCIUM: 8.3 mg/dL — AB (ref 8.4–10.5)
CHLORIDE: 104 mmol/L (ref 96–112)
CO2: 26 mmol/L (ref 19–32)
CO2: 20 mmol/L (ref 19–32)
Chloride: 103 mmol/L (ref 96–112)
Creatinine, Ser: 1.39 mg/dL — ABNORMAL HIGH (ref 0.50–1.35)
Creatinine, Ser: 1.38 mg/dL — ABNORMAL HIGH (ref 0.50–1.35)
GFR calc Af Amer: 53 mL/min — ABNORMAL LOW (ref >90)
GFR calc non Af Amer: 46 mL/min — ABNORMAL LOW (ref >90)
GFR calc non Af Amer: 46 mL/min — ABNORMAL LOW (ref >90)
GFR, EST AFRICAN AMERICAN: 53 mL/min — AB (ref >90)
Glucose, Bld: 103 mg/dL — ABNORMAL HIGH (ref 70–99)
Glucose, Bld: 146 mg/dL — ABNORMAL HIGH (ref 70–99)
Potassium: 5.2 mmol/L — ABNORMAL HIGH (ref 3.5–5.1)
Potassium: 5.3 mmol/L — ABNORMAL HIGH (ref 3.5–5.1)
SODIUM: 136 mmol/L (ref 135–145)
Sodium: 132 mmol/L — ABNORMAL LOW (ref 135–145)

## 2015-01-01 LAB — CBC
HEMATOCRIT: 34 % — AB (ref 39.0–52.0)
HEMOGLOBIN: 11.2 g/dL — AB (ref 13.0–17.0)
MCH: 28.7 pg (ref 26.0–34.0)
MCHC: 32.9 g/dL (ref 30.0–36.0)
MCV: 87.2 fL (ref 78.0–100.0)
Platelets: 219 10*3/uL (ref 150–400)
RBC: 3.9 MIL/uL — AB (ref 4.22–5.81)
RDW: 13.2 % (ref 11.5–15.5)
WBC: 9.7 10*3/uL (ref 4.0–10.5)

## 2015-01-01 LAB — GLUCOSE, CAPILLARY
GLUCOSE-CAPILLARY: 89 mg/dL (ref 70–99)
GLUCOSE-CAPILLARY: 209 mg/dL — AB (ref 70–99)
Glucose-Capillary: 118 mg/dL — ABNORMAL HIGH (ref 70–99)
Glucose-Capillary: 120 mg/dL — ABNORMAL HIGH (ref 70–99)

## 2015-01-01 LAB — HEMOGLOBIN A1C
Hgb A1c MFr Bld: 7.6 % — ABNORMAL HIGH (ref 4.8–5.6)
Mean Plasma Glucose: 171 mg/dL

## 2015-01-01 MED ORDER — AMLODIPINE BESYLATE 10 MG PO TABS
10.0000 mg | ORAL_TABLET | Freq: Every day | ORAL | Status: DC
  Administered 2015-01-01 – 2015-01-03 (×3): 10 mg via ORAL
  Filled 2015-01-01 (×3): qty 1

## 2015-01-01 MED ORDER — DOCUSATE SODIUM 100 MG PO CAPS
200.0000 mg | ORAL_CAPSULE | Freq: Two times a day (BID) | ORAL | Status: DC
  Administered 2015-01-01 – 2015-01-03 (×4): 200 mg via ORAL
  Filled 2015-01-01 (×4): qty 2

## 2015-01-01 MED ORDER — CARVEDILOL 3.125 MG PO TABS
3.1250 mg | ORAL_TABLET | Freq: Two times a day (BID) | ORAL | Status: DC
  Administered 2015-01-01 – 2015-01-03 (×5): 3.125 mg via ORAL
  Filled 2015-01-01 (×5): qty 1

## 2015-01-01 MED ORDER — SODIUM POLYSTYRENE SULFONATE 15 GM/60ML PO SUSP
15.0000 g | Freq: Once | ORAL | Status: AC
  Administered 2015-01-01: 15 g via ORAL
  Filled 2015-01-01: qty 60

## 2015-01-01 MED ORDER — POLYETHYLENE GLYCOL 3350 17 G PO PACK
17.0000 g | PACK | Freq: Two times a day (BID) | ORAL | Status: DC
Start: 2015-01-01 — End: 2015-01-03
  Administered 2015-01-01 – 2015-01-03 (×4): 17 g via ORAL
  Filled 2015-01-01 (×4): qty 1

## 2015-01-01 MED ORDER — SODIUM CHLORIDE 0.9 % IV SOLN
INTRAVENOUS | Status: DC
  Administered 2015-01-01: 17:00:00 via INTRAVENOUS

## 2015-01-01 NOTE — Progress Notes (Signed)
[name], male   DOB: [birth year], 81 y.o.   MRN: [id number] [name]        MRN:  [id number]          DOB/AGE: 1931-02-16 / 81 y.o.    Richard Fears, MD   Richard Borg, PA-C [address] [place], [place]  [address]                             [phone]   PROGRESS NOTE  Subjective:  negative for Chest Pain  negative for Shortness of Breath  negative for Nausea/Vomiting (nausea yesterday, no problem today)  negative for Calf Pain    Tolerating Diet: yes         Patient reports pain as mild and moderate.     Pain controlled.  No real complaints  Objective: Vital signs in last 24 hours:   Patient Vitals for the past 24 hrs:  BP Temp Pulse Resp SpO2  01/01/15 0516 (!) 129/57 mmHg 97.6 F (36.4 C) 71 14 100 %  12/31/14 2025 (!) 146/59 mmHg 97.5 F (36.4 C) (!) 55 14 98 %  12/31/14 1300 (!) 141/58 mmHg - - 14 -  12/31/14 1132 (!) 187/65 mmHg 97.6 F (36.4 C) 79 - 99 %  12/31/14 1107 - 97.4 F (36.3 C) - - -  12/31/14 1100 (!) 153/59 mmHg - (!) 47 12 100 %  12/31/14 1045 (!) 158/61 mmHg - (!) 49 15 100 %  12/31/14 1030 (!) 137/43 mmHg - (!) 48 15 100 %  12/31/14 1015 (!) 151/60 mmHg - (!) 45 10 100 %  12/31/14 1000 (!) 159/53 mmHg - (!) 50 13 100 %  12/31/14 0945 (!) 166/68 mmHg 97.4 F (36.3 C) 99 17 100 %      Intake/Output from previous day:   04/19 0701 - 04/20 0700 In: 2900 [P.O.:600; I.V.:2300] Out: 2150 [Urine:1600; Drains:550]   Intake/Output this shift:       Intake/Output      04/19 0701 - 04/20 0700 04/20 0701 - 04/21 0700   P.O. 600    I.V. (mL/kg) 2300 (28.5)    Total Intake(mL/kg) 2900 (36)    Urine (mL/kg/hr) 1600 (0.8)    Drains 550 (0.3)    Total Output 2150     Net +750             LABORATORY DATA:  Recent Labs  12/31/14 1630 01/01/15 0555  WBC 11.8* 9.7  HGB 12.4* 11.2*  HCT 37.2* 34.0*  PLT 233 219    Recent Labs  12/31/14 1630 01/01/15 0555  NA  --  136  K  --  5.2*  CL  --  103  CO2   --  26  BUN  --  21  CREATININE 1.20 1.39*  GLUCOSE  --  103*  CALCIUM  --  8.6   Lab Results  Component Value Date   INR 1.04 12/23/2014    Recent Radiographic Studies :  Dg Chest 2 View  12/23/2014   CLINICAL DATA:  Preoperative evaluation for total knee arthroplasty  EXAM: CHEST  2 VIEW  COMPARISON:  None.  FINDINGS: The heart size and mediastinal contours are within normal limits. Both lungs are clear. The visualized skeletal structures are unremarkable.  IMPRESSION: No active cardiopulmonary disease.   Electronically Signed   By: Richard Randolph M.D.   On: 12/23/2014 14:51  Examination:  General appearance: alert, cooperative, mild distress and moderate distress Resp: clear to auscultation bilaterally Cardio: regular rate and rhythm - bradycardic GI: normal findings: bowel sounds normal    Wound Exam: clean, dry, intact dressing  Drainage:  None  Motor Exam: EHL, FHL, Anterior Tibial and Posterior Tibial Intact  Sensory Exam: Superficial Peroneal, Deep Peroneal and Tibial normal  Vascular Exam: Right posterior tibial artery has trace pulse  Assessment:    1 Day Post-Op  Procedure(s) (LRB): TOTAL KNEE ARTHROPLASTY (Right)  ADDITIONAL DIAGNOSIS:  Principal Problem:   Primary osteoarthritis of right knee Active Problems:   Primary osteoarthritis of knee  Diabetes and Renal Insufficiency Chronic   Plan: Physical Therapy as ordered Partial Weight Bearing @ 50% (PWB)  DVT Prophylaxis:  Xarelto, Foot Pumps and TED hose  DISCHARGE PLAN: Home  DISCHARGE NEEDS: HHPT, CPM, Walker and 3-in-1 comode seat        Richard Randolph  01/01/2015 7:55 AM

## 2015-01-01 NOTE — Discharge Instructions (Signed)
Information on my medicine - XARELTO (Rivaroxaban)  This medication education was reviewed with me or my healthcare representative as part of my discharge preparation.  The pharmacist that spoke with me during my hospital stay was:  Dareen Piano, [facility]  Why was Xarelto prescribed for you? Xarelto was prescribed for you to reduce the risk of blood clots forming after orthopedic surgery. The medical term for these abnormal blood clots is venous thromboembolism (VTE).  What do you need to know about xarelto ? Take your Xarelto ONCE DAILY at the same time every day. You may take it either with or without food.  If you have difficulty swallowing the tablet whole, you may crush it and mix in applesauce just prior to taking your dose.  Take Xarelto exactly as prescribed by your doctor and DO NOT stop taking Xarelto without talking to the doctor who prescribed the medication.  Stopping without other VTE prevention medication to take the place of Xarelto may increase your risk of developing a clot.  After discharge, you should have regular check-up appointments with your healthcare provider that is prescribing your Xarelto.    What do you do if you miss a dose? If you miss a dose, take it as soon as you remember on the same day then continue your regularly scheduled once daily regimen the next day. Do not take two doses of Xarelto on the same day.   Important Safety Information A possible side effect of Xarelto is bleeding. You should call your healthcare provider right away if you experience any of the following: ? Bleeding from an injury or your nose that does not stop. ? Unusual colored urine (red or dark brown) or unusual colored stools (red or black). ? Unusual bruising for unknown reasons. ? A serious fall or if you hit your head (even if there is no bleeding).  Some medicines may interact with Xarelto and might increase your risk of bleeding while on Xarelto. To help avoid  this, consult your healthcare provider or pharmacist prior to using any new prescription or non-prescription medications, including herbals, vitamins, non-steroidal anti-inflammatory drugs (NSAIDs) and supplements.  This website has more information on Xarelto: [email or web address]

## 2015-01-01 NOTE — Progress Notes (Signed)
Patient found by staff sitting on the floor beside his bed. BP 186/74 and pulse 113 at this time. Patient states he was trying to use the urinal and stood up by himself to help assist with urination,  states his socks "made him slip". Patient has yellow non slip socks on at time of fall. Patient states he is having no pain at this time. Upon assessment patient has skin tear on Left elbow. No other physical injuries noted. Hemovac checked for placement and suction. Patient assisted to chair with chair alarm on and in place. MD on call paged. Biagio Borg, PA returned paged and verbalized desire for Hospitalist consult. Dr. Candiss Norse consulted and orders placed for NS @ 4mL/hour, kayaxelate, and Norvasc 10mg  daily. Hospital bed in patients room switched out for another bed with functioning bed alarm. Will continue to monitor patient.

## 2015-01-01 NOTE — Consult Note (Signed)
Patient Demographics  Richard Randolph, is a 81 y.o. male   MRN: [id number]   DOB - 14-Sep-1930  Admit Date - 12/31/2014    Outpatient Primary MD for the patient is Nicoletta Dress, MD  Consult requested in the Hospital by Garald Balding, MD, On 01/01/2015    Reason for consult Hyperkalemia, ARF, HTN   With History of -  Past Medical History  Diagnosis Date  . Arthritis   . Diabetes mellitus without complication   . Hypertension   . Seizures     as a child age 37  . Degenerative cervical disc   . Hyperlipidemia   . Benign enlargement of prostate     reports that he was born with a large prostate  . Lumbar disc narrowing   . Osteoarthrosis   . Anxiety   . Arthritis of knee   . Failure of erection   . Elevated prostate specific antigen (PSA)   . Vitamin D deficiency   . Vitamin B12 deficiency   . Hyperkalemia   . Benign paroxysmal positional vertigo due to bilateral vestibular disorder   . Depression   . Type 2 diabetes mellitus       Past Surgical History  Procedure Laterality Date  . Knee cartilage surgery Bilateral   . Total knee arthroplasty Right 12/31/2014    Procedure: TOTAL KNEE ARTHROPLASTY;  Surgeon: Garald Balding, MD;  Location: [place];  Service: Orthopedics;  Laterality: Right;    in for   Elective In R Knee arthroplasty  HPI  Richard Randolph  is a 81 y.o. male, with history of essential hypertension, DM type 2 insulin-dependent, DJD, vitamin B-12 deficiency, who was admitted by Orthopedics or elective right knee total arthroplasty. He tolerated the procedure well, today 1 day after his surgery he was noted to be running high blood pressures, also his potassium was slightly elevated and renal function was slightly worse and I was called for medical consultation.  Patient is  completely symptom-free, he denies any headache, no chest pain, no shortness of breath, no abdominal pain or discomfort. No focal weakness. No dysuria. No problems passing urine. His last bowel movement was 2 days ago.    Review of Systems    In addition to the HPI above,   No Fever-chills, No Headache, No changes with Vision or hearing, No problems swallowing food or Liquids, No Chest pain, Cough or Shortness of Breath, No Abdominal pain, No Nausea or Vommitting, Bowel movements are regular, No Blood in stool or Urine, No dysuria, No new skin rashes or bruises, No new joints pains-aches, Mild postop right knee dull ache No new weakness, tingling, numbness in any extremity, No recent weight gain or loss, No polyuria, polydypsia or polyphagia, No significant Mental Stressors.  A full 10 point Review of Systems was done, except as stated above, all other Review of Systems were negative.   Social History History  Substance Use Topics  . Smoking status: Former Smoker    Types: Pipe  Quit date: 09/13/1988  . Smokeless tobacco: Current User    Types: Snuff  . Alcohol Use: No   He lives alone and uses a cane for ambulation  Family History Family History  Problem Relation Age of Onset  . Stroke Father       Prior to Admission medications   Medication Sig Start Date End Date Taking? Authorizing Provider  ALPRAZolam (XANAX) 0.25 MG tablet Take 0.125 mg by mouth at bedtime as needed for sleep.  11/11/14  Yes Historical Provider, MD  Cyanocobalamin (VITAMIN B-12 PO) Place 1 tablet under the tongue daily at 12 noon.   Yes Historical Provider, MD  glipiZIDE (GLUCOTROL) 5 MG tablet Take 5 mg by mouth 2 (two) times daily before a meal.   Yes Historical Provider, MD  Ibuprofen 200 MG CAPS Take 200 mg by mouth as needed (pain, headaches).  10/01/14  Yes Historical Provider, MD  LEVEMIR FLEXTOUCH 100 UNIT/ML Pen Inject 25 Units into the skin at bedtime.  10/22/14  Yes Historical Provider,  MD  lisinopril (PRINIVIL,ZESTRIL) 20 MG tablet Take 20 mg by mouth daily.   Yes Historical Provider, MD  Omega-3 Fatty Acids (FISH OIL) 1000 MG CAPS Take 1,000 mg by mouth 2 (two) times daily.   Yes Historical Provider, MD  atorvastatin (LIPITOR) 10 MG tablet Take 10 mg by mouth daily.    Historical Provider, MD  fenofibrate (TRICOR) 145 MG tablet Take 145 mg by mouth daily.    Historical Provider, MD    Anti-infectives    Start     Dose/Rate Route Frequency Ordered Stop   12/31/14 1330  ceFAZolin (ANCEF) IVPB 2 g/50 mL premix     2 g 100 mL/hr over 30 Minutes Intravenous Every 6 hours 12/31/14 1148 12/31/14 2033   12/31/14 0600  ceFAZolin (ANCEF) IVPB 2 g/50 mL premix     2 g 100 mL/hr over 30 Minutes Intravenous On call to O.R. 12/30/14 1243 12/31/14 0746      Scheduled Meds: . amLODipine  10 mg Oral Daily  . atorvastatin  10 mg Oral q1800  . carvedilol  3.125 mg Oral BID WC  . docusate sodium  200 mg Oral BID  . fenofibrate  160 mg Oral Daily  . glipiZIDE  5 mg Oral BID AC  . insulin aspart  0-15 Units Subcutaneous TID WC  . insulin detemir  25 Units Subcutaneous QHS  . polyethylene glycol  17 g Oral BID  . rivaroxaban  10 mg Oral Q breakfast  . sodium polystyrene  15 g Oral Once   Continuous Infusions: . sodium chloride     PRN Meds:.ALPRAZolam, alum & mag hydroxide-simeth, bisacodyl, diphenhydrAMINE, HYDROmorphone (DILAUDID) injection, [DISCONTINUED] ondansetron **OR** ondansetron (ZOFRAN) IV, oxyCODONE  Allergies  Allergen Reactions  . Ciprofloxacin Other (See Comments)    dizzy    Physical Exam  Vitals  Blood pressure 186/74, pulse 113, temperature 98.2 F (36.8 C), temperature source Oral, resp. rate 18, height 5\' 8"  (1.727 m), weight 80.604 kg (177 lb 11.2 oz), SpO2 98 %.   1. General elderly white male sitting in a recliner  in NAD,     2. Normal affect and insight, Not Suicidal or Homicidal, Awake Alert, Oriented X 3.  3. No F.N deficits, ALL C.Nerves  Intact, Strength 5/5 all 4 extremities, Sensation intact all 4 extremities, Plantars down going.  4. Ears and Eyes appear Normal, Conjunctivae clear, PERRLA. Moist Oral Mucosa.  5. Supple Neck, No JVD, No cervical lymphadenopathy appriciated, No  Carotid Bruits.  6. Symmetrical Chest wall movement, Good air movement bilaterally, CTAB.  7. RRR, No Gallops, Rubs or Murmurs, No Parasternal Heave.  8. Positive Bowel Sounds, Abdomen Soft, No tenderness, No organomegaly appriciated,No rebound -guarding or rigidity.  9.  No Cyanosis, Normal Skin Turgor, No Skin Rash or Bruise.  10. Good muscle tone,  joints appear normal , no effusions, Normal ROM. Right knee postop is under splint with a postop drain in place.  11. No Palpable Lymph Nodes in Neck or Axillae     Data Review  CBC  Recent Labs Lab 12/31/14 1630 01/01/15 0555  WBC 11.8* 9.7  HGB 12.4* 11.2*  HCT 37.2* 34.0*  PLT 233 219  MCV 87.9 87.2  MCH 29.3 28.7  MCHC 33.3 32.9  RDW 13.1 13.2   ------------------------------------------------------------------------------------------------------------------  Chemistries   Recent Labs Lab 12/31/14 1630 01/01/15 0555 01/01/15 0859  NA  --  136 132*  K  --  5.2* 5.3*  CL  --  103 104  CO2  --  26 20  GLUCOSE  --  103* 146*  BUN  --  21 22  CREATININE 1.20 1.39* 1.38*  CALCIUM  --  8.6 8.3*   ------------------------------------------------------------------------------------------------------------------ estimated creatinine clearance is 39.9 mL/min (by C-G formula based on Cr of 1.38). ------------------------------------------------------------------------------------------------------------------ No results for input(s): TSH, T4TOTAL, T3FREE, THYROIDAB in the last 72 hours.  Invalid input(s): FREET3   Coagulation profile No results for input(s): INR, PROTIME in the last 168  hours. ------------------------------------------------------------------------------------------------------------------- No results for input(s): DDIMER in the last 72 hours. -------------------------------------------------------------------------------------------------------------------  Cardiac Enzymes No results for input(s): CKMB, TROPONINI, MYOGLOBIN in the last 168 hours.  Invalid input(s): CK ------------------------------------------------------------------------------------------------------------------ Invalid input(s): POCBNP   ---------------------------------------------------------------------------------------------------------------  Urinalysis    Component Value Date/Time   COLORURINE YELLOW 12/23/2014 1318   APPEARANCEUR CLEAR 12/23/2014 1318   LABSPEC 1.016 12/23/2014 1318   PHURINE 6.0 12/23/2014 1318   GLUCOSEU >1000* 12/23/2014 1318   HGBUR NEGATIVE 12/23/2014 1318   BILIRUBINUR NEGATIVE 12/23/2014 1318   KETONESUR NEGATIVE 12/23/2014 1318   PROTEINUR NEGATIVE 12/23/2014 1318   UROBILINOGEN 1.0 12/23/2014 1318   NITRITE NEGATIVE 12/23/2014 1318   LEUKOCYTESUR NEGATIVE 12/23/2014 1318     Imaging results:   No results found.    EKG My review Old RBBB, 64/min   Assessment & Plan  Principal Problem:   Primary osteoarthritis of right knee Active Problems:   RBBB   Primary osteoarthritis of knee   Hyperkalemia   Essential hypertension    1. Acute renal failure with hyperkalemia. Due to combination of ACE inhibitor, mild dehydration and no BM in the last 2 days. Discontinue ACE inhibitor, gentle hydration, 1 dose of oral Kayexalate, stool softeners. Repeat BMP in the morning. Baseline creatinine is less than 1.1.    2. Essential hypertension. Blood pressure medications adjusted will monitor.   3. Right total knee arthroplasty. We'll defer management to primary team, likely home with home PT.   4. DM type II. Continue present regimen.  Include glipizide, Lantus and sliding scale. Will monitor CBGs closely.  CBG (last 3)   Recent Labs  01/01/15 0629 01/01/15 1140 01/01/15 1625  GLUCAP 89 118* 209*      DVT Prophylaxis Xaralto  AM Labs Ordered, also please review Full Orders  Family Communication: Plan discussed with patient     Thank you for the consult, we will follow the patient with you in the Hospital.   Lala Lund K M.D on 01/01/2015 at 4:24 PM  Between 7am to 7pm - Pager - [phone]  After 7pm go to [email or web address] - password [email or web address]   Thank you for the consult, we will follow the patient with you in the [facility]   Office  [phone]

## 2015-01-01 NOTE — Care Management Note (Signed)
CARE MANAGEMENT NOTE 01/01/2015  Patient:  Richard Randolph, Richard Randolph   Account Number:  [id number]  Date Initiated:  01/01/2015  Documentation initiated by:  Ricki Miller  Subjective/Objective Assessment:   81 yr old male admitted with osteoarthritis of his right knee. patient underwent a right total knee arthroplasty.     Action/Plan:   Case manager spoke with patient concerning home health and DME need.Choice offered for Home Health, referral called to [place] Liaison. Patient states he has family support at discharge.   Anticipated DC Date:  01/02/2015   Anticipated DC Plan:  [place]  CM consult      PAC Choice  [place]   Choice offered to / List presented to:  C-1 Patient   DME arranged  [facility]  3-N-1  CPM      DME agency  [employer]     [place] arranged  HH-2 PT      [place].   Status of service:  Completed, signed off Medicare Important Message given?   (If response is "NO", the following Medicare IM given date fields will be blank) Date Medicare IM given:   Medicare IM given by:   Date Additional Medicare IM given:   Additional Medicare IM given by:    Discharge Disposition:  [place]  Per UR Regulation:  Reviewed for med. necessity/level of care/duration of stay

## 2015-01-01 NOTE — Evaluation (Signed)
Occupational Therapy Evaluation Patient Details Name: Richard Randolph MRN: [id number] DOB: 06-29-1931 Today's Date: 01/01/2015    History of Present Illness Pt is a 81 y/o M s/p R TKA.  Pt's PMH includes HOH, seizures as a child, type II DM, HTN, degenerative cervical disc, lumbar disc narrowing, anxiety, hyperkalemia, BPPV, and depression.   Clinical Impression   Pt is s/p TKA resulting in the deficits listed below (see OT Problem List).  Pt will benefit from skilled OT to increase their safety and independence with ADL and functional mobility for ADL to facilitate discharge to venue listed below.        Follow Up Recommendations  No OT follow up    Equipment Recommendations  None recommended by OT       Precautions / Restrictions Restrictions Weight Bearing Restrictions: Yes RLE Weight Bearing: Partial weight bearing RLE Partial Weight Bearing Percentage or Pounds: 50      Mobility Bed Mobility Overal bed mobility: Modified Independent             General bed mobility comments: use of rails and verbal cues for proper sequencing, increased time  Transfers Overall transfer level: Needs assistance Equipment used: Rolling walker (2 wheeled) Transfers: Sit to/from Stand Sit to Stand: Min assist;From elevated surface         General transfer comment: Cues for hand placement.  Mod A to maintain balance standing EOB 2/2 pt's severe R knee buckling    Balance                                            ADL Overall ADL's : Needs assistance/impaired     Grooming: Set up;Sitting   Upper Body Bathing: Set up;Sitting   Lower Body Bathing: Moderate assistance;Sit to/from stand   Upper Body Dressing : Set up;Sitting   Lower Body Dressing: Moderate assistance;Sit to/from stand       Toileting- Water quality scientist and Hygiene: Minimal assistance;Sit to/from stand       Functional mobility during ADLs: Moderate assistance                  Pertinent Vitals/Pain Pain Score: 3  Pain Location: r knee Pain Descriptors / Indicators: Sore Pain Intervention(s): Monitored during session;Repositioned     Hand Dominance     Extremity/Trunk Assessment Upper Extremity Assessment Upper Extremity Assessment: Overall WFL for tasks assessed           Communication Communication Communication: HOH   Cognition Arousal/Alertness: Awake/alert Behavior During Therapy: WFL for tasks assessed/performed Overall Cognitive Status: Within Functional Limits for tasks assessed                                Home Living Family/patient expects to be discharged to:: Private residence Living Arrangements: Alone;Children (Children will be avail 24/7 for first 3 days) Available Help at Discharge: Family;Available 24 hours/day;Available PRN/intermittently (24/7 for 3 days, intermittent after this) Type of Home: House Home Access: Ramped entrance     Home Layout: One level     Bathroom Shower/Tub: Teacher, early years/pre: Handicapped height     Home Equipment: Environmental consultant - 2 wheels;Cane - single point;Tub bench;Toilet riser          Prior Functioning/Environment Level of Independence: Independent with assistive device(s) (cane at all times)  OT Diagnosis: Generalized weakness   OT Problem List: Decreased strength;Decreased activity tolerance   OT Treatment/Interventions: [facility]/ADL training;DME and/or AE instruction;Patient/family education    OT Goals(Current goals can be found in the care plan section) Acute Rehab OT Goals Patient Stated Goal: to get stronger qnd get to [place] OT Goal Formulation: With patient Time For Goal Achievement: 01/15/15 Potential to Achieve Goals: Good ADL Goals Pt Will Perform Lower Body Dressing: with modified independence Pt Will Transfer to Toilet: with modified independence;ambulating;regular height toilet Pt Will Perform Toileting - Clothing Manipulation  and hygiene: with modified independence;sit to/from stand Pt Will Perform Tub/Shower Transfer: Tub transfer;with modified independence;tub bench  OT Frequency: Min 2X/week              End of Session CPM Right Knee CPM Right Knee: Off Nurse Communication: Mobility status  Activity Tolerance: Patient tolerated treatment well Patient left: in bed;with call bell/phone within reach   Time: 0918-0932 OT Time Calculation (min): 14 min Charges:  OT General Charges $OT Visit: 1 Procedure OT Evaluation $Initial OT Evaluation Tier I: 1 Procedure G-Codes:    Betsy Pries 2015/01/12, 9:48 AM

## 2015-01-01 NOTE — Progress Notes (Signed)
Physical Therapy Treatment Patient Details Name: Richard Randolph MRN: [id number] DOB: 1931-03-11 Today's Date: 01/01/2015    History of Present Illness Pt is a 81 y/o M s/p R TKA.  Pt's PMH includes HOH, seizures as a child, type II DM, HTN, degenerative cervical disc, lumbar disc narrowing, anxiety, hyperkalemia, BPPV, and depression.    PT Comments    Patient is making good progress with PT.  Pt's main limitation remains his R knee buckle 2/2 R knee extension weakness.  Pt w/ R knee buckle x4 during ambulation this session, pt was able to stabilize w/ assist from R KI and RW.  Pt reports he feels stronger when he focuses on contracting his RLE musculature prior to taking step w/ RLE.     Follow Up Recommendations  Home health PT;Supervision for mobility/OOB     Equipment Recommendations  3in1 (PT)    Recommendations for Other Services       Precautions / Restrictions Precautions Precautions: Knee;Fall Precaution Comments: Reviewed no pillow under knee Required Braces or Orthoses: Knee Immobilizer - Right Knee Immobilizer - Right: Other (comment) (Not specified in order; PT recommends on when OOB) Restrictions Weight Bearing Restrictions: Yes RLE Weight Bearing: Partial weight bearing RLE Partial Weight Bearing Percentage or Pounds: 50    Mobility  Bed Mobility Overal bed mobility: Modified Independent             General bed mobility comments: use of rails, increased time  Transfers Overall transfer level: Needs assistance Equipment used: Rolling walker (2 wheeled) Transfers: Sit to/from Stand Sit to Stand: Min guard         General transfer comment: Cues to push from bed.    Ambulation/Gait Ambulation/Gait assistance: Min guard Ambulation Distance (Feet): 150 Feet Assistive device: Rolling walker (2 wheeled) Gait Pattern/deviations: Step-to pattern;Decreased stance time - right;Decreased stride length;Antalgic   Gait velocity interpretation: Below normal  speed for age/gender General Gait Details: Pt reports fatigue of BUEs and RLE during ambulation and requires multiple standing rest breaks.  Pt w/ R knee buckle x 4 during ambulation.  Cues provided to contract RLE musculature prior to taking step w/ R foot.   Stairs            Wheelchair Mobility    Modified Rankin (Stroke Patients Only)       Balance Overall balance assessment: Needs assistance Sitting-balance support: No upper extremity supported;Feet supported Sitting balance-Leahy Scale: Good     Standing balance support: Bilateral upper extremity supported;During functional activity Standing balance-Leahy Scale: Fair                      Cognition Arousal/Alertness: Awake/alert Behavior During Therapy: WFL for tasks assessed/performed Overall Cognitive Status: Within Functional Limits for tasks assessed                      Exercises Total Joint Exercises Ankle Circles/Pumps: AROM;Both;10 reps;Supine Quad Sets: AROM;Both;5 reps;Supine Heel Slides: AROM;Right;10 reps;Supine Knee Flexion: AROM;Right;10 reps;Seated Goniometric ROM: 6-92    General Comments General comments (skin integrity, edema, etc.): KI should be used for any mobility OOB       Pertinent Vitals/Pain Pain Assessment: 0-10 Pain Score: 4  Pain Location: R knee Pain Descriptors / Indicators: Aching Pain Intervention(s): Limited activity within patient's tolerance;Monitored during session;Repositioned    Home Living                      Prior Function  PT Goals (current goals can now be found in the care plan section) Acute Rehab PT Goals Patient Stated Goal: to get stronger  Progress towards PT goals: Progressing toward goals    Frequency  7X/week    PT Plan Current plan remains appropriate    Co-evaluation             End of Session Equipment Utilized During Treatment: Gait belt Activity Tolerance: Patient tolerated treatment  well;Patient limited by fatigue Patient left: in bed;with call bell/phone within reach;with family/visitor present     Time: 1202-1225 PT Time Calculation (min) (ACUTE ONLY): 23 min  Charges:  $Gait Training: 23-37 mins $Therapeutic Exercise: 8-22 mins                    G Codes:      Joslyn Hy PT, [place] [phone] Pager: [phone] 01/01/2015, 2:31 PM

## 2015-01-01 NOTE — Progress Notes (Signed)
Patients daughter Talitha Givens [phone] wishing to speak with doctor/therapist/case manager regarding patients discharge to home. States she is not sure if 24 hour care will be able to be provided at this time. Thanks!

## 2015-01-01 NOTE — Progress Notes (Signed)
Physical Therapy Treatment Patient Details Name: Richard Randolph MRN: [id number] DOB: 1931-02-16 Today's Date: 01/01/2015    History of Present Illness Pt is a 81 y/o M s/p R TKA.  Pt's PMH includes HOH, seizures as a child, type II DM, HTN, degenerative cervical disc, lumbar disc narrowing, anxiety, hyperkalemia, BPPV, and depression.    PT Comments    Pt w/ improved R knee extension strength; however pt w/ mod R knee buckling x5 w/ ambulation.  Pt able to stabilize w/ use of R KI and RW.  Pt's ambulatory distance limited by fatigue of BUEs 2/2 pt's 50% PWB on RLE.  Pt is progressing well w/ PT.   Follow Up Recommendations  Home health PT;Supervision for mobility/OOB     Equipment Recommendations  3in1 (PT)    Recommendations for Other Services       Precautions / Restrictions Precautions Precautions: Knee;Fall Precaution Comments: Reviewed no pillow under knee Required Braces or Orthoses: Knee Immobilizer - Right Knee Immobilizer - Right: Other (comment) (Not specified in order; PT recommends on when OOB) Restrictions Weight Bearing Restrictions: Yes RLE Weight Bearing: Partial weight bearing RLE Partial Weight Bearing Percentage or Pounds: 50    Mobility  Bed Mobility Overal bed mobility: Modified Independent             General bed mobility comments: use of rails and verbal cues for proper sequencing, increased time  Transfers Overall transfer level: Needs assistance Equipment used: Rolling walker (2 wheeled) Transfers: Sit to/from Stand Sit to Stand: Min guard         General transfer comment: Cues to push from bed.    Ambulation/Gait Ambulation/Gait assistance: Min guard Ambulation Distance (Feet): 75 Feet Assistive device: Rolling walker (2 wheeled) Gait Pattern/deviations: Step-to pattern;Decreased stance time - right;Decreased stride length;Antalgic   Gait velocity interpretation: Below normal speed for age/gender General Gait Details: Pt reports  fatigue of BUE limiting ambulatory distance 2/2 PWB precautions.  Pt w/ mod knee buckling x5 while ambulating but pt was able to stabilize 2/2 use of R KI and RW.   Stairs            Wheelchair Mobility    Modified Rankin (Stroke Patients Only)       Balance Overall balance assessment: Needs assistance Sitting-balance support: No upper extremity supported;Feet supported Sitting balance-Leahy Scale: Good     Standing balance support: Bilateral upper extremity supported;During functional activity Standing balance-Leahy Scale: Fair (2/2 knee buckling)                      Cognition Arousal/Alertness: Awake/alert Behavior During Therapy: WFL for tasks assessed/performed Overall Cognitive Status: Within Functional Limits for tasks assessed                      Exercises Total Joint Exercises Quad Sets: AROM;Both;5 reps;Supine Heel Slides: AROM;Right;10 reps;Supine Knee Flexion: AROM;Right;10 reps;Seated Goniometric ROM: 6-92    General Comments General comments (skin integrity, edema, etc.): KI should be used for any mobility OOB       Pertinent Vitals/Pain Pain Assessment: 0-10 Pain Score: 3  Pain Location: R knee Pain Descriptors / Indicators: Grimacing;Heaviness;Pressure Pain Intervention(s): Limited activity within patient's tolerance;Monitored during session;Repositioned    Home Living Family/patient expects to be discharged to:: Private residence Living Arrangements: Alone;Children (Children will be avail 24/7 for first 3 days) Available Help at Discharge: Family;Available 24 hours/day;Available PRN/intermittently (24/7 for 3 days, intermittent after this) Type of Home: [place]  Access: Ramped entrance   Home Layout: One level Home Equipment: Walker - 2 wheels;Cane - single point;Tub bench;Toilet riser      Prior Function Level of Independence: Independent with assistive device(s) (cane at all times)          PT Goals (current  goals can now be found in the care plan section) Acute Rehab PT Goals Patient Stated Goal: to get stronger  Progress towards PT goals: Progressing toward goals    Frequency  7X/week    PT Plan Current plan remains appropriate    Co-evaluation             End of Session Equipment Utilized During Treatment: Gait belt Activity Tolerance: Patient tolerated treatment well;Patient limited by fatigue Patient left: in chair;with call bell/phone within reach;with family/visitor present     Time: 6283-1517 PT Time Calculation (min) (ACUTE ONLY): 27 min  Charges:  $Gait Training: 8-22 mins $Therapeutic Exercise: 8-22 mins                    G Codes:      Joslyn Hy PT, [place] [phone] Pager: [phone] 01/01/2015, 11:15 AM

## 2015-01-02 DIAGNOSIS — N179 Acute kidney failure, unspecified: Secondary | ICD-10-CM

## 2015-01-02 DIAGNOSIS — E118 Type 2 diabetes mellitus with unspecified complications: Secondary | ICD-10-CM

## 2015-01-02 LAB — CBC
HEMATOCRIT: 32.6 % — AB (ref 39.0–52.0)
Hemoglobin: 11 g/dL — ABNORMAL LOW (ref 13.0–17.0)
MCH: 29.4 pg (ref 26.0–34.0)
MCHC: 33.7 g/dL (ref 30.0–36.0)
MCV: 87.2 fL (ref 78.0–100.0)
Platelets: 224 10*3/uL (ref 150–400)
RBC: 3.74 MIL/uL — AB (ref 4.22–5.81)
RDW: 13.1 % (ref 11.5–15.5)
WBC: 9.1 10*3/uL (ref 4.0–10.5)

## 2015-01-02 LAB — GLUCOSE, CAPILLARY
GLUCOSE-CAPILLARY: 164 mg/dL — AB (ref 70–99)
GLUCOSE-CAPILLARY: 189 mg/dL — AB (ref 70–99)
Glucose-Capillary: 150 mg/dL — ABNORMAL HIGH (ref 70–99)
Glucose-Capillary: 185 mg/dL — ABNORMAL HIGH (ref 70–99)

## 2015-01-02 LAB — BASIC METABOLIC PANEL
Anion gap: 8 (ref 5–15)
BUN: 13 mg/dL (ref 6–23)
CALCIUM: 8.5 mg/dL (ref 8.4–10.5)
CO2: 23 mmol/L (ref 19–32)
Chloride: 104 mmol/L (ref 96–112)
Creatinine, Ser: 1.07 mg/dL (ref 0.50–1.35)
GFR calc Af Amer: 73 mL/min — ABNORMAL LOW (ref >90)
GFR, EST NON AFRICAN AMERICAN: 63 mL/min — AB (ref >90)
GLUCOSE: 156 mg/dL — AB (ref 70–99)
Potassium: 4.9 mmol/L (ref 3.5–5.1)
SODIUM: 135 mmol/L (ref 135–145)

## 2015-01-02 MED ORDER — AMLODIPINE BESYLATE 10 MG PO TABS: 10.0000 mg | ORAL_TABLET | Freq: Every day | ORAL | Status: DC

## 2015-01-02 MED ORDER — CARVEDILOL 3.125 MG PO TABS: 3.1250 mg | ORAL_TABLET | Freq: Two times a day (BID) | ORAL | Status: DC

## 2015-01-02 MED ORDER — OXYCODONE HCL 5 MG PO TABS: 5.0000 mg | ORAL_TABLET | ORAL | Status: DC | PRN

## 2015-01-02 MED ORDER — RIVAROXABAN 10 MG PO TABS: 10.0000 mg | ORAL_TABLET | Freq: Every day | ORAL | Status: DC

## 2015-01-02 NOTE — Progress Notes (Signed)
Patient ID: JSAON YOO, male   DOB: Sep 23, 1930, 81 y.o.   MRN: [id number] [name]        MRN:  [id number]          DOB/AGE: 19-Sep-1930 / 81 y.o.    Joni Fears, MD   Biagio Borg, PA-C [address] [place], [place]  [address]                             [phone]   PROGRESS NOTE  Subjective:  negative for Chest Pain  negative for Shortness of Breath  negative for Nausea/Vomiting   negative for Calf Pain    Tolerating Diet: yes         Patient reports pain as mild and moderate.     Wants to go home  Objective: Vital signs in last 24 hours:   Patient Vitals for the past 24 hrs:  BP Temp Temp src Pulse Resp SpO2  01/02/15 0528 (!) 123/55 mmHg 98.8 F (37.1 C) - (!) 101 16 93 %  01/02/15 0000 - - - - 16 96 %  01/01/15 2011 (!) 146/78 mmHg 99.1 F (37.3 C) - (!) 114 18 99 %  01/01/15 2000 - - - - 18 97 %  01/01/15 1518 (!) 186/74 mmHg 98.2 F (36.8 C) Oral (!) 113 - 98 %  01/01/15 1300 (!) 151/68 mmHg 98 F (36.7 C) Oral 86 18 100 %      Intake/Output from previous day:   04/20 0701 - 04/21 0700 In: 595.8 [P.O.:480; I.V.:115.8] Out: 650 [Urine:450; Drains:200]   Intake/Output this shift:       Intake/Output      04/20 0701 - 04/21 0700 04/21 0701 - 04/22 0700   P.O. 480    I.V. (mL/kg) 115.8 (1.4)    Total Intake(mL/kg) 595.8 (7.4)    Urine (mL/kg/hr) 450 (0.2)    Drains 200 (0.1)    Total Output 650     Net -54.2          Urine Occurrence 6 x       LABORATORY DATA:  Recent Labs  12/31/14 1630 01/01/15 0555  WBC 11.8* 9.7  HGB 12.4* 11.2*  HCT 37.2* 34.0*  PLT 233 219    Recent Labs  12/31/14 1630 01/01/15 0555 01/01/15 0859  NA  --  136 132*  K  --  5.2* 5.3*  CL  --  103 104  CO2  --  26 20  BUN  --  21 22  CREATININE 1.20 1.39* 1.38*  GLUCOSE  --  103* 146*  CALCIUM  --  8.6 8.3*   Lab Results  Component Value Date   INR 1.04 12/23/2014    Recent Radiographic Studies :  Dg Chest 2 View  12/23/2014    CLINICAL DATA:  Preoperative evaluation for total knee arthroplasty  EXAM: CHEST  2 VIEW  COMPARISON:  None.  FINDINGS: The heart size and mediastinal contours are within normal limits. Both lungs are clear. The visualized skeletal structures are unremarkable.  IMPRESSION: No active cardiopulmonary disease.   Electronically Signed   By: Inez Catalina M.D.   On: 12/23/2014 14:51     Examination:  General appearance: alert, cooperative, mild distress and moderate distress Resp: clear to auscultation bilaterally Cardio: regular rate and rhythm GI: normal findings: bowel sounds normal slight decreased but active  Wound Exam: clean, dry, intact   Drainage:  None: wound tissue dry  Motor Exam: EHL, FHL, Anterior Tibial and Posterior Tibial Intact  Sensory Exam: Superficial Peroneal, Deep Peroneal and Tibial normal  Vascular Exam: Right posterior tibial artery has trace pulse  Assessment:    2 Days Post-Op  Procedure(s) (LRB): TOTAL KNEE ARTHROPLASTY (Right)  ADDITIONAL DIAGNOSIS:  Principal Problem:   Primary osteoarthritis of right knee Active Problems:   RBBB   Primary osteoarthritis of knee   Hyperkalemia   Essential hypertension  Diabetes   Plan: Physical Therapy as ordered Partial Weight Bearing @ 50% (PWB)  DVT Prophylaxis:  Xarelto, Foot Pumps and TED hose  DISCHARGE PLAN: Home  DISCHARGE NEEDS: HHPT, CPM, Walker and 3-in-1 comode seat   Plan for D/C today if labs improved.        [facility]  01/02/2015 9:07 AM

## 2015-01-02 NOTE — Clinical Social Work Note (Signed)
Clinical Social Work Assessment  Patient Details  Name: Richard Randolph MRN: [id number] Date of Birth: 1931/07/21  Date of referral:  01/02/15               Reason for consult:  Facility Placement, Discharge Planning                Permission sought to share information with:  Family Supports Permission granted to share information::  Yes, Verbal Permission Granted  Name::     Sherrin Daisy and Adrian::     Relationship::  Patient's daughters.  Contact Information:  Vicky: [phone], BRKVTX: [phone]  Housing/Transportation Living arrangements for the past 2 months:  Single Family Home Source of Information:  Patient Patient Interpreter Needed:  None Criminal Activity/Legal Involvement Pertinent to Current Situation/Hospitalization:  No - Comment as needed (Not appropriate at this time.) Significant Relationships:  Adult Children Lives with:  Citron Do you feel safe going back to the place where you live?  Yes Need for family participation in patient care:  No (Coment)  Care giving concerns:  Patient expressed concern regarding proximity from SNF to patient's home and family.   Social Worker assessment / plan:  CSW received referral from PT, stating patient is no longer safe to discharge home and are now recommending SNF placement at discharge. CSW met with patient and patient's son-in-law at bedside to discuss discharge disposition. Per patient, patient agreeable to SNF placement in [facility]. Patient stated patient would prefer to discharge home, but is understanding of PT recommendation. Per patient, patient from home alone but has family locally that are able to provide support. Patient informed CSW patient has a daughter, Sherrin Daisy, who lives across from patient. Patient reported patient also has a supportive daughter, Freda Munro, and a son who lives in [place], [place]. Patient's son-in-law stated he would update patient's daughters regarding change in  disposition.  Employment status:  Retired Nurse, adult PT Recommendations:  [place] / Referral to community resources:  [place]  Patient/Family's Response to care:  Patient and patient's family understanding and agreeable to CSW plan of care.  Patient/Family's Understanding of and Emotional Response to Diagnosis, Current Treatment, and Prognosis:  Patient and patient's family understanding and agreeable to CSW plan of care.  Emotional Assessment Appearance:  Appears younger than stated age, Well-Groomed Attitude/Demeanor/Rapport:  Other (Patient presented as pleasant during assessment.) Affect (typically observed):  Accepting, Pleasant Orientation:  Oriented to [place], Oriented to Place, Oriented to  Time, Oriented to Situation Alcohol / Substance use:  Not Applicable Psych involvement (Current and /or in the community):  No (Comment) (Not appropriate at this time.)  Discharge Needs  Concerns to be addressed:  Discharge Planning Concerns Readmission within the last 30 days:  No Current discharge risk:  None Barriers to Discharge:  No Barriers Identified   Caroline Sauger, LCSW 01/02/2015, 3:24 PM  [phone]

## 2015-01-02 NOTE — Discharge Summary (Signed)
Joni Fears, MD   Biagio Borg, PA-C [address], [place], [place]  [address]                             [phone]  [name]        MRN:  [id number]          DOB/AGE: 06/05/1931 / 81 y.o.    DISCHARGE SUMMARY  ADMISSION DATE:    12/31/2014 DISCHARGE DATE:   01/03/2015   ADMISSION DIAGNOSIS: END STAGE PRIMARY OSTEOARTHRITIS OF RIGHT KNEE    DISCHARGE DIAGNOSIS:  END STAGE PRIMARY OSTEOARTHRITIS OF RIGHT KNEE    ADDITIONAL DIAGNOSIS: Principal Problem:   Primary osteoarthritis of right knee Active Problems:   RBBB   Primary osteoarthritis of knee   Hyperkalemia   Essential hypertension Acute Renal Failure   Past Medical History  Diagnosis Date  . Arthritis   . Diabetes mellitus without complication   . Hypertension   . Seizures     as a child age 67  . Degenerative cervical disc   . Hyperlipidemia   . Benign enlargement of prostate     reports that he was born with a large prostate  . Lumbar disc narrowing   . Osteoarthrosis   . Anxiety   . Arthritis of knee   . Failure of erection   . Elevated prostate specific antigen (PSA)   . Vitamin D deficiency   . Vitamin B12 deficiency   . Hyperkalemia   . Benign paroxysmal positional vertigo due to bilateral vestibular disorder   . Depression   . Type 2 diabetes mellitus     PROCEDURE: Procedure(s): TOTAL KNEE ARTHROPLASTY Right on 12/31/2014  CONSULTS:   [facility]   HISTORY: Richard Randolph is a very pleasant, 81 year old, white, widowed male who is seen today for evaluation of his right knee. He has had problems with the right knee dating back to probably around 25-30 years ago, and he has had worsening pain and discomfort. About 40 years ago in the 1970's, he did have a meniscectomy performed in both knees. This was through an open procedure. He had been having so much pain that in January, he actually went to the [facility] and had x-rays and then was referred initially to  [place]. He states that he has had pain with the knees since his previous surgeries, but now it has gotten to the point where he is having pain with every step. He denies any radicular pain or any pain in the calf. His pain is centered mainly around both knees, but the right is worse than the left. He has not really had much swelling, but he does have instability symptoms on the right more than the left. This is affecting his ability to do his activities of daily living. He cannot straighten his knee fully, which also has been a problem for him. He has had corticosteroid injections which have been somewhat beneficial. He does have chondrocalcinosis CPPD in both knees with the right greater than the left. It is noted on x-rays that he has decreased medial joint space by about 35-40 degrees  HOSPITAL COURSE:  Grainger Mccarley Lusher is a 81 y.o. admitted on 12/31/2014 and found to have a diagnosis of [place].  After appropriate laboratory studies were obtained  they were taken to the operating room on 12/31/2014 and underwent  Procedure(s): TOTAL KNEE ARTHROPLASTY  Right.  They were given perioperative antibiotics:  Anti-infectives    Start     Dose/Rate Route Frequency Ordered Stop   12/31/14 1330  ceFAZolin (ANCEF) IVPB 2 g/50 mL premix     2 g 100 mL/hr over 30 Minutes Intravenous Every 6 hours 12/31/14 1148 12/31/14 2033   12/31/14 0600  ceFAZolin (ANCEF) IVPB 2 g/50 mL premix     2 g 100 mL/hr over 30 Minutes Intravenous On call to O.R. 12/30/14 1243 12/31/14 0746    .  Tolerated the procedure well.  Placed with a foley intraoperatively.     Toradol was given post op.  POD #1, allowed out of bed to a chair.  PT for ambulation and exercise program.  Foley D/C'd in morning.  IV saline locked.  O2 discontionued.  Had developed pressure and pulse variability as well as elevation of potassium.  Consultation requested from hospitalist.  Meds changed.  POD #2, continued  PT and ambulation.   Hemovac pulled. Dressing changed.  Wound looked good.  Was ready for D/C from hospital but family now states they can not take care of him.  Hospitalist signed off on patient.   POD# #3, continued PT.  Discharged to SNF  The remainder of the hospital course was dedicated to ambulation and strengthening.   The patient was discharged on 3 Days Post-Op in  Stable condition.  Blood products given:none  DIAGNOSTIC STUDIES: Recent vital signs:  Patient Vitals for the past 24 hrs:  BP Temp Temp src Pulse Resp SpO2  01/03/15 0623 (!) 135/59 mmHg 98 F (36.7 C) Oral 76 18 97 %  01/03/15 0400 - - - - 16 97 %  01/03/15 0000 - - - - 16 97 %  01/02/15 2129 (!) 149/64 mmHg 99.4 F (37.4 C) Oral 87 16 97 %  01/02/15 2000 - - - - 16 97 %  01/02/15 1717 (!) 122/55 mmHg - - - - -  01/02/15 1400 (!) 143/62 mmHg 99.5 F (37.5 C) - 87 18 99 %  01/02/15 1026 (!) 123/55 mmHg - - - - -       Recent laboratory studies:  Recent Labs  12/31/14 1630 01/01/15 0555 01/02/15 0744 01/03/15 0646  WBC 11.8* 9.7 9.1 7.6  HGB 12.4* 11.2* 11.0* 10.4*  HCT 37.2* 34.0* 32.6* 31.0*  PLT 233 219 224 214    Recent Labs  12/31/14 1630 01/01/15 0555 01/01/15 0859 01/02/15 0744  NA  --  136 132* 135  K  --  5.2* 5.3* 4.9  CL  --  103 104 104  CO2  --  26 20 23   BUN  --  21 22 13   CREATININE 1.20 1.39* 1.38* 1.07  GLUCOSE  --  103* 146* 156*  CALCIUM  --  8.6 8.3* 8.5   Lab Results  Component Value Date   INR 1.04 12/23/2014     Recent Radiographic Studies :  Dg Chest 2 View  12/23/2014   CLINICAL DATA:  Preoperative evaluation for total knee arthroplasty  EXAM: CHEST  2 VIEW  COMPARISON:  None.  FINDINGS: The heart size and mediastinal contours are within normal limits. Both lungs are clear. The visualized skeletal structures are unremarkable.  IMPRESSION: No active cardiopulmonary disease.   Electronically Signed   By: Inez Catalina M.D.   On: 12/23/2014 14:51    DISCHARGE  INSTRUCTIONS:     Discharge Instructions    CPM    Complete by:  As directed   Continuous  passive motion machine (CPM):      Use the CPM from 0 to 60 for 6-8 hours per day.      You may increase by 5-10 degrees per day.  You may break it up into 2 or 3 sessions per day.      Use CPM for 3-4  weeks or until you are told to stop.     Call MD / Call 911    Complete by:  As directed   If you experience chest pain or shortness of breath, CALL 911 and be transported to the hospital emergency room.  If you develope a fever above 101 F, pus (white drainage) or increased drainage or redness at the wound, or calf pain, call your surgeon's office.     Change dressing    Complete by:  As directed   DO NOT CHANGE YOUR DRESSING     Constipation Prevention    Complete by:  As directed   Drink plenty of fluids.  Prune juice may be helpful.  You may use a stool softener, such as Colace (over the counter) 100 mg twice a day.  Use MiraLax (over the counter) for constipation as needed.     Diet general    Complete by:  As directed      Do not put a pillow under the knee. Place it under the heel.    Complete by:  As directed      Driving restrictions    Complete by:  As directed   No driving for 6 weeks     Increase activity slowly as tolerated    Complete by:  As directed      Lifting restrictions    Complete by:  As directed   No lifting for 6 weeks     Partial weight bearing    Complete by:  As directed   % Body Weight:  50%  Laterality:  right  Extremity:  Lower     Patient may shower    Complete by:  As directed   You may shower over the brown dressing     TED hose    Complete by:  As directed   Use stockings (TED hose) for 2-3 weeks on right leg.  You may remove them at night for sleeping.           DISCHARGE MEDICATIONS:     Medication List    STOP taking these medications        Fish Oil 1000 MG Caps     Ibuprofen 200 MG Caps     lisinopril 20 MG tablet  Commonly known as:   PRINIVIL,ZESTRIL      TAKE these medications        ALPRAZolam 0.25 MG tablet  Commonly known as:  XANAX  Take 0.125 mg by mouth at bedtime as needed for sleep.     amLODipine 10 MG tablet  Commonly known as:  NORVASC  Take 1 tablet (10 mg total) by mouth daily.     atorvastatin 10 MG tablet  Commonly known as:  LIPITOR  Take 10 mg by mouth daily.     carvedilol 3.125 MG tablet  Commonly known as:  COREG  Take 1 tablet (3.125 mg total) by mouth 2 (two) times daily with a meal.     fenofibrate 145 MG tablet  Commonly known as:  TRICOR  Take 145 mg by mouth daily.     glipiZIDE 5 MG tablet  Commonly known as:  GLUCOTROL  Take 5 mg by mouth 2 (two) times daily before a meal.     LEVEMIR FLEXTOUCH 100 UNIT/ML Pen  Generic drug:  Insulin Detemir  Inject 25 Units into the skin at bedtime.     oxyCODONE 5 MG immediate release tablet  Commonly known as:  Oxy IR/ROXICODONE  Take 1 tablet (5 mg total) by mouth every 4 (four) hours as needed for moderate pain, severe pain or breakthrough pain.     rivaroxaban 10 MG Tabs tablet  Commonly known as:  XARELTO  Take 1 tablet (10 mg total) by mouth daily with breakfast.     VITAMIN B-12 PO  Place 1 tablet under the tongue daily at 12 noon.        FOLLOW UP VISIT:   Follow-up Information    Follow up with [facility].   Why:  Someone from [facility] will contact you concerning start time for therapy.   Contact information:   [address] [place] [address] [phone]       Follow up with Garald Balding, MD. Schedule an appointment as soon as possible for a visit on 01/13/2015.   Specialty:  Orthopedic Surgery   Contact information:   [address] [place] [address] [phone]       DISPOSITION: To SNF   CONDITION:  Stable   Mike Craze. [place], [place] [phone]  01/03/2015 8:47 AM

## 2015-01-02 NOTE — Progress Notes (Addendum)
PATIENT DETAILS Name: Richard Randolph Age: 81 y.o. Sex: male Date of Birth: 23-Oct-1930 Admit Date: 12/31/2014 Admitting Physician Garald Balding, MD IAX:[id number], MD  Subjective: Anxious to be discharged. Apart from pain at the operative site, does not have any other complaints.  Assessment/Plan: Principal Problem:   Acute renal failure:likely prerenal azotemia, resolved with hydration. Would not resume lisinopril on discharge for now.    Hyperkalemia: Resolved. Secondary to lisinopril. As stated above, please do not resume lisinopril on discharge.    Hypertension: Please continue Coreg and amlodipine on discharge. Would not send  patient home on lisinopril.    Type 2 diabetes: CBGs stable, continue glipizide and Levemir on discharge     Dyslipidemia: Continue with Lipitor and TriCor and discharge      Rest per primary service  Please ensure patient has follow-up with PCP in the next 1-2 weeks for continued monitoring and post hospital discharge   Okay for discharge-hospitalist service will sign off. Please call if any further questions   Anti-infectives    Start     Dose/Rate Route Frequency Ordered Stop   12/31/14 1330  ceFAZolin (ANCEF) IVPB 2 g/50 mL premix     2 g 100 mL/hr over 30 Minutes Intravenous Every 6 hours 12/31/14 1148 12/31/14 2033   12/31/14 0600  ceFAZolin (ANCEF) IVPB 2 g/50 mL premix     2 g 100 mL/hr over 30 Minutes Intravenous On call to O.R. 12/30/14 1243 12/31/14 0746      MEDICATIONS: Scheduled Meds: . amLODipine  10 mg Oral Daily  . atorvastatin  10 mg Oral q1800  . carvedilol  3.125 mg Oral BID WC  . docusate sodium  200 mg Oral BID  . fenofibrate  160 mg Oral Daily  . glipiZIDE  5 mg Oral BID AC  . insulin aspart  0-15 Units Subcutaneous TID WC  . insulin detemir  25 Units Subcutaneous QHS  . polyethylene glycol  17 g Oral BID  . rivaroxaban  10 mg Oral Q breakfast   Continuous Infusions: . sodium chloride 50  mL/hr at 01/01/15 1641   PRN Meds:.ALPRAZolam, alum & mag hydroxide-simeth, bisacodyl, diphenhydrAMINE, HYDROmorphone (DILAUDID) injection, [DISCONTINUED] ondansetron **OR** ondansetron (ZOFRAN) IV, oxyCODONE    PHYSICAL EXAM: Vital signs in last 24 hours: Filed Vitals:   01/01/15 2011 01/02/15 0000 01/02/15 0528 01/02/15 1026  BP: 146/78  123/55 123/55  Pulse: 114  101   Temp: 99.1 F (37.3 C)  98.8 F (37.1 C)   TempSrc:      Resp: 18 16 16    Height:      Weight:      SpO2: 99% 96% 93%     Weight change:  Filed Weights   12/31/14 0634  Weight: 80.604 kg (177 lb 11.2 oz)   Body mass index is 27.03 kg/(m^2).   Gen Exam: Awake and alert with clear speech.   Neck: Supple, No JVD.   Chest: B/L Clear.   CVS: S1 S2 Regular, no murmurs.  Abdomen: soft, BS +, non tender, non distended.  Extremities: no edema, right lower extremity-with compression stocking-dressing in place over right knee Neurologic: Non Focal.   Skin: No Rash.   Wounds: N/A.   Intake/Output from previous day:  Intake/Output Summary (Last 24 hours) at 01/02/15 1123 Last data filed at 01/02/15 0528  Gross per 24 hour  Intake 355.83 ml  Output  550 ml  Net -194.17 ml     LAB RESULTS: CBC  Recent Labs Lab 12/31/14 1630 01/01/15 0555 01/02/15 0744  WBC 11.8* 9.7 9.1  HGB 12.4* 11.2* 11.0*  HCT 37.2* 34.0* 32.6*  PLT 233 219 224  MCV 87.9 87.2 87.2  MCH 29.3 28.7 29.4  MCHC 33.3 32.9 33.7  RDW 13.1 13.2 13.1    Chemistries   Recent Labs Lab 12/31/14 1630 01/01/15 0555 01/01/15 0859 01/02/15 0744  NA  --  136 132* 135  K  --  5.2* 5.3* 4.9  CL  --  103 104 104  CO2  --  26 20 23   GLUCOSE  --  103* 146* 156*  BUN  --  21 22 13   CREATININE 1.20 1.39* 1.38* 1.07  CALCIUM  --  8.6 8.3* 8.5    CBG:  Recent Labs Lab 01/01/15 0629 01/01/15 1140 01/01/15 1625 01/01/15 2134 01/02/15 0620  GLUCAP 89 118* 209* 120* 150*    GFR Estimated Creatinine Clearance: 51.5 mL/min  (by C-G formula based on Cr of 1.07).  Coagulation profile No results for input(s): INR, PROTIME in the last 168 hours.  Cardiac Enzymes No results for input(s): CKMB, TROPONINI, MYOGLOBIN in the last 168 hours.  Invalid input(s): CK  Invalid input(s): POCBNP No results for input(s): DDIMER in the last 72 hours.  Recent Labs  12/31/14 1630  HGBA1C 7.6*   No results for input(s): CHOL, HDL, LDLCALC, TRIG, CHOLHDL, LDLDIRECT in the last 72 hours. No results for input(s): TSH, T4TOTAL, T3FREE, THYROIDAB in the last 72 hours.  Invalid input(s): FREET3 No results for input(s): VITAMINB12, FOLATE, FERRITIN, TIBC, IRON, RETICCTPCT in the last 72 hours. No results for input(s): LIPASE, AMYLASE in the last 72 hours.  Urine Studies No results for input(s): UHGB, CRYS in the last 72 hours.  Invalid input(s): UACOL, UAPR, USPG, UPH, UTP, UGL, UKET, UBIL, UNIT, UROB, ULEU, UEPI, UWBC, URBC, UBAC, CAST, UCOM, BILUA  MICROBIOLOGY: Recent Results (from the past 240 hour(s))  Urine culture     Status: None   Collection Time: 12/23/14  1:18 PM  Result Value Ref Range Status   Specimen Description URINE, CLEAN CATCH  Final   Special Requests NONE  Final   Colony Count NO GROWTH Performed at [facility]   Final   Culture NO GROWTH Performed at [facility]   Final   Report Status 12/24/2014 FINAL  Final  Surgical pcr screen     Status: None   Collection Time: 12/23/14  1:19 PM  Result Value Ref Range Status   MRSA, PCR NEGATIVE NEGATIVE Final   Staphylococcus aureus NEGATIVE NEGATIVE Final    Comment:        The Xpert SA Assay (FDA approved for NASAL specimens in patients over 49 years of age), is one component of a comprehensive surveillance program.  Test performance has been validated by [facility] for patients greater than or equal to 53 year old. It is not intended to diagnose infection nor to guide or monitor treatment.     RADIOLOGY  STUDIES/RESULTS: Dg Chest 2 View  12/23/2014   CLINICAL DATA:  Preoperative evaluation for total knee arthroplasty  EXAM: CHEST  2 VIEW  COMPARISON:  None.  FINDINGS: The heart size and mediastinal contours are within normal limits. Both lungs are clear. The visualized skeletal structures are unremarkable.  IMPRESSION: No active cardiopulmonary disease.   Electronically Signed   By: Inez Catalina M.D.   On:  12/23/2014 14:51    Oren Binet, MD  [facility] Pager:[phone]  If 7PM-7AM, please contact night-coverage [email or web address] Password [email or web address] 01/02/2015, 11:23 AM   LOS: 2 days

## 2015-01-02 NOTE — Progress Notes (Signed)
OT Cancellation Note  Patient Details Name: Richard Randolph MRN: [id number] DOB: 08/03/1931   Cancelled Treatment:    Reason Eval/Treat Not Completed: Other (comment) D/C plan changed to SNF. Will defer further OT to SNF.  [place], OTR/L  [phone] 01/02/2015 01/02/2015, 12:05 PM

## 2015-01-02 NOTE — Progress Notes (Signed)
Physical Therapy Treatment Patient Details Name: Richard Randolph MRN: [id number] DOB: December 12, 1930 Today's Date: 01/02/2015    History of Present Illness Pt is a 81 y/o M s/p R TKA.  Pt's PMH includes HOH, seizures as a child, type II DM, HTN, degenerative cervical disc, lumbar disc narrowing, anxiety, hyperkalemia, BPPV, and depression.    PT Comments    Pt's ambulatory distance was limited 2/2 fatigue of RLE this session.  Pt had fall on 01/01/15 when getting OOB on his own.  Emphasized to pt the importance of having assist anytime he is OOB or chair, pt verbalized understanding.  Spoke to daughter on the phone who reports she is now no longer able to provide 24/7 assist to pt and daughter will be at work 8am-5pm everyday and will only be available for 1 hour in the morning and a few hours after work to provide assist to pt.  Relayed information to MD (Dr. Durward Fortes) regarding fall and change in assist and it was decided that pt be d/c to SNF due to pt's poor endurance, strength in RLE, dec caregiver support, and high risk for fall.  Notified pt's CM and SW on updated d/c status.   Follow Up Recommendations  SNF;Supervision for mobility/OOB     Equipment Recommendations  3in1 (PT)    Recommendations for Other Services       Precautions / Restrictions Precautions Precautions: Knee;Fall Precaution Comments: Reviewed no pillow under knee Required Braces or Orthoses: Knee Immobilizer - Right Knee Immobilizer - Right:  (Not specified in order; PT recommends on when OOB) Restrictions Weight Bearing Restrictions: Yes RLE Weight Bearing: Partial weight bearing RLE Partial Weight Bearing Percentage or Pounds: 50    Mobility  Bed Mobility Overal bed mobility: Modified Independent             General bed mobility comments: use of rails, increased time  Transfers Overall transfer level: Needs assistance Equipment used: Rolling walker (2 wheeled) Transfers: Sit to/from Stand Sit to  Stand: Min guard         General transfer comment: Cues to push from bed.  Pt has decreased awareness of safety and attempts to pull from RW.  Ambulation/Gait Ambulation/Gait assistance: Min guard (close chair follow) Ambulation Distance (Feet): 75 Feet Assistive device: Rolling walker (2 wheeled) Gait Pattern/deviations: Step-to pattern;Trunk flexed;Shuffle;Decreased stance time - right Gait velocity: decreased Gait velocity interpretation: Below normal speed for age/gender General Gait Details: Pt reports fatigue and R knee buckles x6 this session.  Each time, pt is able to stabilize using RW and wearing KI.     Stairs            Wheelchair Mobility    Modified Rankin (Stroke Patients Only)       Balance Overall balance assessment: Needs assistance;History of Falls (fall on 01/01/15) Sitting-balance support: No upper extremity supported;Feet supported Sitting balance-Leahy Scale: Good     Standing balance support: Bilateral upper extremity supported;During functional activity Standing balance-Leahy Scale: Fair Standing balance comment: Pt w/ knee buckle when ambulating and should be gaurded closely                    Cognition Arousal/Alertness: Awake/alert Behavior During Therapy: WFL for tasks assessed/performed Overall Cognitive Status: Within Functional Limits for tasks assessed       Memory: Decreased short-term memory (Did not remember working w/ therapy two times yesterday)              Exercises Total Joint Exercises  Ankle Circles/Pumps: AROM;Both;10 reps;Supine Quad Sets: AROM;Both;Supine;10 reps Goniometric ROM: 7-99    General Comments General comments (skin integrity, edema, etc.): KI should be used for any mobility OOB       Pertinent Vitals/Pain Pain Assessment: 0-10 Pain Score: 6  Pain Location: R knee Pain Descriptors / Indicators: Sore Pain Intervention(s): Limited activity within patient's tolerance;Monitored during  session;Repositioned    Home Living                      Prior Function            PT Goals (current goals can now be found in the care plan section) Acute Rehab PT Goals Patient Stated Goal: to get stronger so he can go to the beach Progress towards PT goals: Progressing toward goals    Frequency  7X/week    PT Plan Discharge plan needs to be updated    Co-evaluation             End of Session Equipment Utilized During Treatment: Gait belt (close chair follow) Activity Tolerance: Patient tolerated treatment well;Patient limited by fatigue Patient left: in chair;with call bell/phone within reach;with chair alarm set     Time: 1040-1106 PT Time Calculation (min) (ACUTE ONLY): 26 min  Charges:  $Gait Training: 8-22 mins $Therapeutic Exercise: 8-22 mins                    G Codes:      Joslyn Hy PT, [place] [phone] Pager: [phone] 01/02/2015, 11:16 AM

## 2015-01-02 NOTE — Clinical Social Work Placement (Signed)
   CLINICAL SOCIAL WORK PLACEMENT  NOTE  Date:  01/02/2015  Patient Details  Name: Richard Randolph MRN: [id number] Date of Birth: 03-05-1931  Clinical Social Work is seeking post-discharge placement for this patient at the [place] level of care (*CSW will initial, date and re-position this form in  chart as items are completed):  Yes   Patient/family provided with [facility] Work Department's list of facilities offering this level of care within the geographic area requested by the patient (or if unable, by the patient's family).  Yes   Patient/family informed of their freedom to choose among providers that offer the needed level of care, that participate in Medicare, Medicaid or managed care program needed by the patient, have an available bed and are willing to accept the patient.  Yes   Patient/family informed of [facility]'s ownership interest in [facility] and [facility], as well as of the fact that they are under no obligation to receive care at these facilities.  PASRR submitted to EDS on 01/02/15     PASRR number received on 01/02/15     Existing PASRR number confirmed on       FL2 transmitted to all facilities in geographic area requested by pt/family on 01/02/15     FL2 transmitted to all facilities within larger geographic area on       Patient informed that his/her managed care company has contracts with or will negotiate with certain facilities, including the following:            Patient/family informed of bed offers received.  Patient chooses bed at       Physician recommends and patient chooses bed at      Patient to be transferred to   on  .  Patient to be transferred to facility by       Patient family notified on   of transfer.  Name of family member notified:        PHYSICIAN       Additional Comment:    _______________________________________________ Caroline Sauger, LCSW 01/02/2015, 3:36 PM [phone]

## 2015-01-02 NOTE — Progress Notes (Signed)
Orthopedic Tech Progress Note Patient Details:  Richard Randolph October 15, 1930 [id number] On cpm at 7:05 pm Patient ID: [name], male   DOB: 09-24-1930, 81 y.o.   MRN: [id number]   [name] 01/02/2015, 7:06 PM

## 2015-01-03 DIAGNOSIS — Z96651 Presence of right artificial knee joint: Secondary | ICD-10-CM | POA: Diagnosis not present

## 2015-01-03 DIAGNOSIS — D649 Anemia, unspecified: Secondary | ICD-10-CM | POA: Diagnosis not present

## 2015-01-03 DIAGNOSIS — E119 Type 2 diabetes mellitus without complications: Secondary | ICD-10-CM | POA: Diagnosis not present

## 2015-01-03 DIAGNOSIS — M1711 Unilateral primary osteoarthritis, right knee: Secondary | ICD-10-CM | POA: Diagnosis not present

## 2015-01-03 DIAGNOSIS — G8918 Other acute postprocedural pain: Secondary | ICD-10-CM | POA: Diagnosis not present

## 2015-01-03 DIAGNOSIS — Z471 Aftercare following joint replacement surgery: Secondary | ICD-10-CM | POA: Diagnosis not present

## 2015-01-03 DIAGNOSIS — N183 Chronic kidney disease, stage 3 (moderate): Secondary | ICD-10-CM | POA: Diagnosis not present

## 2015-01-03 LAB — CBC
HCT: 31 % — ABNORMAL LOW (ref 39.0–52.0)
Hemoglobin: 10.4 g/dL — ABNORMAL LOW (ref 13.0–17.0)
MCH: 29.1 pg (ref 26.0–34.0)
MCHC: 33.5 g/dL (ref 30.0–36.0)
MCV: 86.8 fL (ref 78.0–100.0)
PLATELETS: 214 10*3/uL (ref 150–400)
RBC: 3.57 MIL/uL — AB (ref 4.22–5.81)
RDW: 13 % (ref 11.5–15.5)
WBC: 7.6 10*3/uL (ref 4.0–10.5)

## 2015-01-03 LAB — GLUCOSE, CAPILLARY
GLUCOSE-CAPILLARY: 145 mg/dL — AB (ref 70–99)
Glucose-Capillary: 123 mg/dL — ABNORMAL HIGH (ref 70–99)
Glucose-Capillary: 148 mg/dL — ABNORMAL HIGH (ref 70–99)

## 2015-01-03 NOTE — Progress Notes (Signed)
Subjective: 3 Days Post-Op Procedure(s) (LRB): TOTAL KNEE ARTHROPLASTY (Right) Patient reports pain as 3 on 0-10 scale.    Objective: Vital signs in last 24 hours: Temp:  [98 F (36.7 C)-99.5 F (37.5 C)] 98 F (36.7 C) (04/22 0623) Pulse Rate:  [76-87] 76 (04/22 0623) Resp:  [16-18] 18 (04/22 0623) BP: (122-149)/(55-64) 135/59 mmHg (04/22 0623) SpO2:  [97 %-99 %] 97 % (04/22 0623)  Intake/Output from previous day: 04/21 0701 - 04/22 0700 In: 600 [P.O.:600] Out: 350 [Urine:350] Intake/Output this shift:     Recent Labs  12/31/14 1630 01/01/15 0555 01/02/15 0744  HGB 12.4* 11.2* 11.0*    Recent Labs  01/01/15 0555 01/02/15 0744  WBC 9.7 9.1  RBC 3.90* 3.74*  HCT 34.0* 32.6*  PLT 219 224    Recent Labs  01/01/15 0859 01/02/15 0744  NA 132* 135  K 5.3* 4.9  CL 104 104  CO2 20 23  BUN 22 13  CREATININE 1.38* 1.07  GLUCOSE 146* 156*  CALCIUM 8.3* 8.5   No results for input(s): LABPT, INR in the last 72 hours.  Neurologically intact-no calf pain, denies SOB or chest pain  Assessment/Plan: 3 Days Post-Op Procedure(s) (LRB): TOTAL KNEE ARTHROPLASTY (Right) Up with therapy Discharge to SNF- FL-2 signed-pt stable and ready for D/C  [facility], Richard Randolph W 01/03/2015, 7:42 AM

## 2015-01-03 NOTE — Progress Notes (Signed)
During hourly rounding found chewing tobacco in patients room. Pt educated on smoking cessation and chewing tobacco removed from patients room until d/c.

## 2015-01-03 NOTE — Discharge Planning (Signed)
Patient to be discharged to [facility] in [place]. Patient's daughter, Sherrin Daisy, updated regarding discharge.  [employer] Medicare auth: [id number]  Facility: [facility] Report number: [phone] Transportation: EMS ([address])  Lubertha Say[place], [place] Cell: [phone]       Fax: [phone] Clinical Social Work: Orthopedics [phone]) and Surgical [phone])

## 2015-01-03 NOTE — Clinical Social Work Placement (Signed)
   CLINICAL SOCIAL WORK PLACEMENT  NOTE  Date:  01/03/2015  Patient Details  Name: Richard Randolph MRN: [id number] Date of Birth: 1930/10/21  Clinical Social Work is seeking post-discharge placement for this patient at the [place] level of care (*CSW will initial, date and re-position this form in  chart as items are completed):  Yes   Patient/family provided with [facility] Work Department's list of facilities offering this level of care within the geographic area requested by the patient (or if unable, by the patient's family).  Yes   Patient/family informed of their freedom to choose among providers that offer the needed level of care, that participate in Medicare, Medicaid or managed care program needed by the patient, have an available bed and are willing to accept the patient.  Yes   Patient/family informed of [facility]'s ownership interest in [facility] and [facility], as well as of the fact that they are under no obligation to receive care at these facilities.  PASRR submitted to EDS on 01/02/15     PASRR number received on 01/02/15     Existing PASRR number confirmed on       FL2 transmitted to all facilities in geographic area requested by pt/family on 01/02/15     FL2 transmitted to all facilities within larger geographic area on 01/03/15     Patient informed that his/her managed care company has contracts with or will negotiate with certain facilities, including the following:        Yes   Patient/family informed of bed offers received.  Patient chooses bed at [facility]     Physician recommends and patient chooses bed at      Patient to be transferred to [facility] on 01/03/15.  Patient to be transferred to facility by [facility]     Patient family notified on 01/03/15 of transfer.  Name of family member notified:  Sherrin Daisy     PHYSICIAN       Additional Comment:     _______________________________________________ Caroline Sauger, LCSW 01/03/2015, 2:43 PM  [phone]

## 2015-01-03 NOTE — Progress Notes (Signed)
Physical Therapy Treatment Patient Details Name: Richard Randolph MRN: [id number] DOB: Dec 04, 1930 Today's Date: 01/03/2015    History of Present Illness Pt is a 81 y/o M s/p R TKA.  Pt's PMH includes HOH, seizures as a child, type II DM, HTN, degenerative cervical disc, lumbar disc narrowing, anxiety, hyperkalemia, BPPV, and depression.    PT Comments    Pt continues to demonstrate R knee buckle w/ ambulatory activity but able to stabilize w/ KI and using RW.  Pt reports fatigue of RLE and BUEs at end of ambulatory distance and will benefit from further skilled PT to increase endurance.    Follow Up Recommendations  SNF;Supervision for mobility/OOB     Equipment Recommendations  3in1 (PT)    Recommendations for Other Services       Precautions / Restrictions Precautions Precautions: Knee;Fall Precaution Comments: Reviewed no pillow under knee Required Braces or Orthoses: Knee Immobilizer - Right Knee Immobilizer - Right: Other (comment) (Not specified in order; PT recommends on when OOB) Restrictions Weight Bearing Restrictions: Yes RLE Weight Bearing: Partial weight bearing RLE Partial Weight Bearing Percentage or Pounds: 50    Mobility  Bed Mobility Overal bed mobility: Modified Independent             General bed mobility comments: use of rails, increased time  Transfers Overall transfer level: Needs assistance Equipment used: Rolling walker (2 wheeled) Transfers: Sit to/from Stand Sit to Stand: Min guard         General transfer comment: Cues to push from bed  Ambulation/Gait Ambulation/Gait assistance: Min guard Ambulation Distance (Feet): 75 Feet Assistive device: Rolling walker (2 wheeled) Gait Pattern/deviations: Step-to pattern;Decreased stance time - right;Decreased stride length;Antalgic;Trunk flexed Gait velocity: decreased Gait velocity interpretation: Below normal speed for age/gender General Gait Details: Increased trunk flexion this session  which pt does not correct following verbal cues.  Knee buckling x1 at end of ambulatory distance.   Stairs            Wheelchair Mobility    Modified Rankin (Stroke Patients Only)       Balance Overall balance assessment: Needs assistance Sitting-balance support: No upper extremity supported;Feet supported Sitting balance-Leahy Scale: Good     Standing balance support: Bilateral upper extremity supported;During functional activity Standing balance-Leahy Scale: Fair Standing balance comment: Pt w/ knee buckle when ambulating and should be guarded closely                    Cognition Arousal/Alertness: Awake/alert Behavior During Therapy: WFL for tasks assessed/performed Overall Cognitive Status: Within Functional Limits for tasks assessed                      Exercises Total Joint Exercises Hip ABduction/ADduction: AAROM;Right;5 reps;Seated Knee Flexion: AROM;Right;10 reps;Seated Goniometric ROM: 5-92    General Comments General comments (skin integrity, edema, etc.): KI should be used for any mobility OOB       Pertinent Vitals/Pain Pain Assessment: 0-10 Pain Score: 2  Pain Location: R knee Pain Descriptors / Indicators: Aching Pain Intervention(s): Limited activity within patient's tolerance;Monitored during session;Repositioned    Home Living                      Prior Function            PT Goals (current goals can now be found in the care plan section) Acute Rehab PT Goals Patient Stated Goal: to get stronger so he can go to  the beach Progress towards PT goals: Progressing toward goals    Frequency  7X/week    PT Plan Current plan remains appropriate    Co-evaluation             End of Session Equipment Utilized During Treatment: Gait belt Activity Tolerance: Patient tolerated treatment well Patient left: in chair;with call bell/phone within reach;with chair alarm set;with family/visitor present     Time:  1203-1232 PT Time Calculation (min) (ACUTE ONLY): 29 min  Charges:  $Gait Training: 8-22 mins $Therapeutic Exercise: 8-22 mins                    G Codes:      Joslyn Hy PT, [place] [phone] Pager: [phone] 01/03/2015, 1:04 PM

## 2015-01-03 NOTE — Progress Notes (Signed)
Discharge instruction gave to pt and all questions answered. Report gave to LPN, Luann at [facility], [place]. PT is ready to discharge.

## 2015-01-08 DIAGNOSIS — D649 Anemia, unspecified: Secondary | ICD-10-CM | POA: Diagnosis not present

## 2015-01-08 DIAGNOSIS — G8918 Other acute postprocedural pain: Secondary | ICD-10-CM | POA: Diagnosis not present

## 2015-01-08 DIAGNOSIS — N183 Chronic kidney disease, stage 3 (moderate): Secondary | ICD-10-CM | POA: Diagnosis not present

## 2015-01-08 DIAGNOSIS — Z471 Aftercare following joint replacement surgery: Secondary | ICD-10-CM | POA: Diagnosis not present

## 2015-01-13 DIAGNOSIS — M1711 Unilateral primary osteoarthritis, right knee: Secondary | ICD-10-CM | POA: Diagnosis not present

## 2015-01-15 DIAGNOSIS — Z96651 Presence of right artificial knee joint: Secondary | ICD-10-CM | POA: Diagnosis not present

## 2015-01-17 DIAGNOSIS — M25561 Pain in right knee: Secondary | ICD-10-CM | POA: Diagnosis not present

## 2015-01-17 DIAGNOSIS — R262 Difficulty in walking, not elsewhere classified: Secondary | ICD-10-CM | POA: Diagnosis not present

## 2015-01-17 DIAGNOSIS — Z794 Long term (current) use of insulin: Secondary | ICD-10-CM | POA: Diagnosis not present

## 2015-01-17 DIAGNOSIS — E119 Type 2 diabetes mellitus without complications: Secondary | ICD-10-CM | POA: Diagnosis not present

## 2015-01-20 DIAGNOSIS — Z794 Long term (current) use of insulin: Secondary | ICD-10-CM | POA: Diagnosis not present

## 2015-01-20 DIAGNOSIS — R262 Difficulty in walking, not elsewhere classified: Secondary | ICD-10-CM | POA: Diagnosis not present

## 2015-01-20 DIAGNOSIS — M25561 Pain in right knee: Secondary | ICD-10-CM | POA: Diagnosis not present

## 2015-01-20 DIAGNOSIS — E119 Type 2 diabetes mellitus without complications: Secondary | ICD-10-CM | POA: Diagnosis not present

## 2015-01-21 DIAGNOSIS — Z794 Long term (current) use of insulin: Secondary | ICD-10-CM | POA: Diagnosis not present

## 2015-01-21 DIAGNOSIS — M25561 Pain in right knee: Secondary | ICD-10-CM | POA: Diagnosis not present

## 2015-01-21 DIAGNOSIS — R262 Difficulty in walking, not elsewhere classified: Secondary | ICD-10-CM | POA: Diagnosis not present

## 2015-01-21 DIAGNOSIS — E119 Type 2 diabetes mellitus without complications: Secondary | ICD-10-CM | POA: Diagnosis not present

## 2015-01-23 DIAGNOSIS — E119 Type 2 diabetes mellitus without complications: Secondary | ICD-10-CM | POA: Diagnosis not present

## 2015-01-23 DIAGNOSIS — M25561 Pain in right knee: Secondary | ICD-10-CM | POA: Diagnosis not present

## 2015-01-23 DIAGNOSIS — Z794 Long term (current) use of insulin: Secondary | ICD-10-CM | POA: Diagnosis not present

## 2015-01-23 DIAGNOSIS — R262 Difficulty in walking, not elsewhere classified: Secondary | ICD-10-CM | POA: Diagnosis not present

## 2015-01-27 DIAGNOSIS — Z794 Long term (current) use of insulin: Secondary | ICD-10-CM | POA: Diagnosis not present

## 2015-01-27 DIAGNOSIS — R262 Difficulty in walking, not elsewhere classified: Secondary | ICD-10-CM | POA: Diagnosis not present

## 2015-01-27 DIAGNOSIS — M25561 Pain in right knee: Secondary | ICD-10-CM | POA: Diagnosis not present

## 2015-01-27 DIAGNOSIS — E119 Type 2 diabetes mellitus without complications: Secondary | ICD-10-CM | POA: Diagnosis not present

## 2015-01-28 DIAGNOSIS — R262 Difficulty in walking, not elsewhere classified: Secondary | ICD-10-CM | POA: Diagnosis not present

## 2015-01-28 DIAGNOSIS — Z794 Long term (current) use of insulin: Secondary | ICD-10-CM | POA: Diagnosis not present

## 2015-01-28 DIAGNOSIS — E119 Type 2 diabetes mellitus without complications: Secondary | ICD-10-CM | POA: Diagnosis not present

## 2015-01-28 DIAGNOSIS — M25561 Pain in right knee: Secondary | ICD-10-CM | POA: Diagnosis not present

## 2015-01-30 DIAGNOSIS — M25561 Pain in right knee: Secondary | ICD-10-CM | POA: Diagnosis not present

## 2015-01-30 DIAGNOSIS — E119 Type 2 diabetes mellitus without complications: Secondary | ICD-10-CM | POA: Diagnosis not present

## 2015-01-30 DIAGNOSIS — R262 Difficulty in walking, not elsewhere classified: Secondary | ICD-10-CM | POA: Diagnosis not present

## 2015-01-30 DIAGNOSIS — Z794 Long term (current) use of insulin: Secondary | ICD-10-CM | POA: Diagnosis not present

## 2015-02-03 DIAGNOSIS — R262 Difficulty in walking, not elsewhere classified: Secondary | ICD-10-CM | POA: Diagnosis not present

## 2015-02-03 DIAGNOSIS — E119 Type 2 diabetes mellitus without complications: Secondary | ICD-10-CM | POA: Diagnosis not present

## 2015-02-03 DIAGNOSIS — M25561 Pain in right knee: Secondary | ICD-10-CM | POA: Diagnosis not present

## 2015-02-03 DIAGNOSIS — Z794 Long term (current) use of insulin: Secondary | ICD-10-CM | POA: Diagnosis not present

## 2015-02-05 DIAGNOSIS — E119 Type 2 diabetes mellitus without complications: Secondary | ICD-10-CM | POA: Diagnosis not present

## 2015-02-05 DIAGNOSIS — R262 Difficulty in walking, not elsewhere classified: Secondary | ICD-10-CM | POA: Diagnosis not present

## 2015-02-05 DIAGNOSIS — M25561 Pain in right knee: Secondary | ICD-10-CM | POA: Diagnosis not present

## 2015-02-05 DIAGNOSIS — Z794 Long term (current) use of insulin: Secondary | ICD-10-CM | POA: Diagnosis not present

## 2015-02-06 DIAGNOSIS — Z794 Long term (current) use of insulin: Secondary | ICD-10-CM | POA: Diagnosis not present

## 2015-02-06 DIAGNOSIS — M25561 Pain in right knee: Secondary | ICD-10-CM | POA: Diagnosis not present

## 2015-02-06 DIAGNOSIS — E119 Type 2 diabetes mellitus without complications: Secondary | ICD-10-CM | POA: Diagnosis not present

## 2015-02-06 DIAGNOSIS — R262 Difficulty in walking, not elsewhere classified: Secondary | ICD-10-CM | POA: Diagnosis not present

## 2015-02-10 DIAGNOSIS — R262 Difficulty in walking, not elsewhere classified: Secondary | ICD-10-CM | POA: Diagnosis not present

## 2015-02-10 DIAGNOSIS — Z794 Long term (current) use of insulin: Secondary | ICD-10-CM | POA: Diagnosis not present

## 2015-02-10 DIAGNOSIS — E119 Type 2 diabetes mellitus without complications: Secondary | ICD-10-CM | POA: Diagnosis not present

## 2015-02-10 DIAGNOSIS — M25561 Pain in right knee: Secondary | ICD-10-CM | POA: Diagnosis not present

## 2015-02-11 DIAGNOSIS — R262 Difficulty in walking, not elsewhere classified: Secondary | ICD-10-CM | POA: Diagnosis not present

## 2015-02-11 DIAGNOSIS — E119 Type 2 diabetes mellitus without complications: Secondary | ICD-10-CM | POA: Diagnosis not present

## 2015-02-11 DIAGNOSIS — M25561 Pain in right knee: Secondary | ICD-10-CM | POA: Diagnosis not present

## 2015-02-11 DIAGNOSIS — Z794 Long term (current) use of insulin: Secondary | ICD-10-CM | POA: Diagnosis not present

## 2015-02-13 DIAGNOSIS — R262 Difficulty in walking, not elsewhere classified: Secondary | ICD-10-CM | POA: Diagnosis not present

## 2015-02-13 DIAGNOSIS — M25561 Pain in right knee: Secondary | ICD-10-CM | POA: Diagnosis not present

## 2015-02-13 DIAGNOSIS — E119 Type 2 diabetes mellitus without complications: Secondary | ICD-10-CM | POA: Diagnosis not present

## 2015-02-13 DIAGNOSIS — Z794 Long term (current) use of insulin: Secondary | ICD-10-CM | POA: Diagnosis not present

## 2015-02-17 DIAGNOSIS — E119 Type 2 diabetes mellitus without complications: Secondary | ICD-10-CM | POA: Diagnosis not present

## 2015-02-17 DIAGNOSIS — M25561 Pain in right knee: Secondary | ICD-10-CM | POA: Diagnosis not present

## 2015-02-17 DIAGNOSIS — R262 Difficulty in walking, not elsewhere classified: Secondary | ICD-10-CM | POA: Diagnosis not present

## 2015-02-17 DIAGNOSIS — Z794 Long term (current) use of insulin: Secondary | ICD-10-CM | POA: Diagnosis not present

## 2015-02-20 DIAGNOSIS — M25561 Pain in right knee: Secondary | ICD-10-CM | POA: Diagnosis not present

## 2015-02-20 DIAGNOSIS — E119 Type 2 diabetes mellitus without complications: Secondary | ICD-10-CM | POA: Diagnosis not present

## 2015-02-20 DIAGNOSIS — Z794 Long term (current) use of insulin: Secondary | ICD-10-CM | POA: Diagnosis not present

## 2015-02-20 DIAGNOSIS — R262 Difficulty in walking, not elsewhere classified: Secondary | ICD-10-CM | POA: Diagnosis not present

## 2015-06-14 DIAGNOSIS — E785 Hyperlipidemia, unspecified: Secondary | ICD-10-CM | POA: Diagnosis not present

## 2015-06-14 DIAGNOSIS — I10 Essential (primary) hypertension: Secondary | ICD-10-CM | POA: Diagnosis not present

## 2015-06-14 DIAGNOSIS — E1140 Type 2 diabetes mellitus with diabetic neuropathy, unspecified: Secondary | ICD-10-CM | POA: Diagnosis not present

## 2015-06-14 DIAGNOSIS — Z23 Encounter for immunization: Secondary | ICD-10-CM | POA: Diagnosis not present

## 2015-06-14 DIAGNOSIS — E559 Vitamin D deficiency, unspecified: Secondary | ICD-10-CM | POA: Diagnosis not present

## 2015-06-14 DIAGNOSIS — D519 Vitamin B12 deficiency anemia, unspecified: Secondary | ICD-10-CM | POA: Diagnosis not present

## 2015-06-14 DIAGNOSIS — H6123 Impacted cerumen, bilateral: Secondary | ICD-10-CM | POA: Diagnosis not present

## 2015-06-14 DIAGNOSIS — R531 Weakness: Secondary | ICD-10-CM | POA: Diagnosis not present

## 2015-06-19 DIAGNOSIS — R2681 Unsteadiness on feet: Secondary | ICD-10-CM | POA: Diagnosis not present

## 2015-06-19 DIAGNOSIS — R262 Difficulty in walking, not elsewhere classified: Secondary | ICD-10-CM | POA: Diagnosis not present

## 2015-06-19 DIAGNOSIS — R42 Dizziness and giddiness: Secondary | ICD-10-CM | POA: Diagnosis not present

## 2015-06-24 DIAGNOSIS — R42 Dizziness and giddiness: Secondary | ICD-10-CM | POA: Diagnosis not present

## 2015-06-24 DIAGNOSIS — R262 Difficulty in walking, not elsewhere classified: Secondary | ICD-10-CM | POA: Diagnosis not present

## 2015-06-24 DIAGNOSIS — R2681 Unsteadiness on feet: Secondary | ICD-10-CM | POA: Diagnosis not present

## 2015-06-26 DIAGNOSIS — R2681 Unsteadiness on feet: Secondary | ICD-10-CM | POA: Diagnosis not present

## 2015-06-26 DIAGNOSIS — R42 Dizziness and giddiness: Secondary | ICD-10-CM | POA: Diagnosis not present

## 2015-06-26 DIAGNOSIS — R262 Difficulty in walking, not elsewhere classified: Secondary | ICD-10-CM | POA: Diagnosis not present

## 2015-07-01 DIAGNOSIS — R262 Difficulty in walking, not elsewhere classified: Secondary | ICD-10-CM | POA: Diagnosis not present

## 2015-07-01 DIAGNOSIS — R42 Dizziness and giddiness: Secondary | ICD-10-CM | POA: Diagnosis not present

## 2015-07-01 DIAGNOSIS — R2681 Unsteadiness on feet: Secondary | ICD-10-CM | POA: Diagnosis not present

## 2015-07-03 DIAGNOSIS — R42 Dizziness and giddiness: Secondary | ICD-10-CM | POA: Diagnosis not present

## 2015-07-03 DIAGNOSIS — R262 Difficulty in walking, not elsewhere classified: Secondary | ICD-10-CM | POA: Diagnosis not present

## 2015-07-03 DIAGNOSIS — R2681 Unsteadiness on feet: Secondary | ICD-10-CM | POA: Diagnosis not present

## 2015-07-08 DIAGNOSIS — E119 Type 2 diabetes mellitus without complications: Secondary | ICD-10-CM | POA: Diagnosis not present

## 2015-09-01 DIAGNOSIS — M25551 Pain in right hip: Secondary | ICD-10-CM | POA: Diagnosis not present

## 2015-09-01 DIAGNOSIS — M1711 Unilateral primary osteoarthritis, right knee: Secondary | ICD-10-CM | POA: Diagnosis not present

## 2015-09-27 DIAGNOSIS — E785 Hyperlipidemia, unspecified: Secondary | ICD-10-CM | POA: Diagnosis not present

## 2015-09-27 DIAGNOSIS — I10 Essential (primary) hypertension: Secondary | ICD-10-CM | POA: Diagnosis not present

## 2015-09-27 DIAGNOSIS — Z6826 Body mass index (BMI) 26.0-26.9, adult: Secondary | ICD-10-CM | POA: Diagnosis not present

## 2015-09-27 DIAGNOSIS — E1140 Type 2 diabetes mellitus with diabetic neuropathy, unspecified: Secondary | ICD-10-CM | POA: Diagnosis not present

## 2015-09-27 DIAGNOSIS — E559 Vitamin D deficiency, unspecified: Secondary | ICD-10-CM | POA: Diagnosis not present

## 2015-09-27 DIAGNOSIS — Z1389 Encounter for screening for other disorder: Secondary | ICD-10-CM | POA: Diagnosis not present

## 2015-09-27 DIAGNOSIS — D519 Vitamin B12 deficiency anemia, unspecified: Secondary | ICD-10-CM | POA: Diagnosis not present

## 2015-11-04 DIAGNOSIS — Z6826 Body mass index (BMI) 26.0-26.9, adult: Secondary | ICD-10-CM | POA: Diagnosis not present

## 2015-11-04 DIAGNOSIS — M25512 Pain in left shoulder: Secondary | ICD-10-CM | POA: Diagnosis not present

## 2015-11-04 DIAGNOSIS — L989 Disorder of the skin and subcutaneous tissue, unspecified: Secondary | ICD-10-CM | POA: Diagnosis not present

## 2015-11-13 DIAGNOSIS — Z6826 Body mass index (BMI) 26.0-26.9, adult: Secondary | ICD-10-CM | POA: Diagnosis not present

## 2015-11-13 DIAGNOSIS — I10 Essential (primary) hypertension: Secondary | ICD-10-CM | POA: Diagnosis not present

## 2015-11-13 DIAGNOSIS — R42 Dizziness and giddiness: Secondary | ICD-10-CM | POA: Diagnosis not present

## 2015-11-17 DIAGNOSIS — L728 Other follicular cysts of the skin and subcutaneous tissue: Secondary | ICD-10-CM | POA: Diagnosis not present

## 2015-11-17 DIAGNOSIS — C44519 Basal cell carcinoma of skin of other part of trunk: Secondary | ICD-10-CM | POA: Diagnosis not present

## 2015-11-17 DIAGNOSIS — L578 Other skin changes due to chronic exposure to nonionizing radiation: Secondary | ICD-10-CM | POA: Diagnosis not present

## 2015-11-17 DIAGNOSIS — L720 Epidermal cyst: Secondary | ICD-10-CM | POA: Diagnosis not present

## 2015-11-17 DIAGNOSIS — L820 Inflamed seborrheic keratosis: Secondary | ICD-10-CM | POA: Diagnosis not present

## 2015-12-02 DIAGNOSIS — L659 Nonscarring hair loss, unspecified: Secondary | ICD-10-CM | POA: Diagnosis not present

## 2015-12-02 DIAGNOSIS — Z6826 Body mass index (BMI) 26.0-26.9, adult: Secondary | ICD-10-CM | POA: Diagnosis not present

## 2015-12-02 DIAGNOSIS — R251 Tremor, unspecified: Secondary | ICD-10-CM | POA: Diagnosis not present

## 2015-12-02 DIAGNOSIS — D519 Vitamin B12 deficiency anemia, unspecified: Secondary | ICD-10-CM | POA: Diagnosis not present

## 2016-03-03 DIAGNOSIS — R42 Dizziness and giddiness: Secondary | ICD-10-CM | POA: Diagnosis not present

## 2016-03-03 DIAGNOSIS — E1065 Type 1 diabetes mellitus with hyperglycemia: Secondary | ICD-10-CM | POA: Diagnosis not present

## 2016-03-03 DIAGNOSIS — N410 Acute prostatitis: Secondary | ICD-10-CM | POA: Diagnosis not present

## 2016-03-03 DIAGNOSIS — R739 Hyperglycemia, unspecified: Secondary | ICD-10-CM | POA: Diagnosis not present

## 2016-03-03 DIAGNOSIS — I10 Essential (primary) hypertension: Secondary | ICD-10-CM | POA: Diagnosis not present

## 2016-03-11 DIAGNOSIS — E663 Overweight: Secondary | ICD-10-CM | POA: Diagnosis not present

## 2016-03-11 DIAGNOSIS — D519 Vitamin B12 deficiency anemia, unspecified: Secondary | ICD-10-CM | POA: Diagnosis not present

## 2016-03-11 DIAGNOSIS — E559 Vitamin D deficiency, unspecified: Secondary | ICD-10-CM | POA: Diagnosis not present

## 2016-03-11 DIAGNOSIS — E1140 Type 2 diabetes mellitus with diabetic neuropathy, unspecified: Secondary | ICD-10-CM | POA: Diagnosis not present

## 2016-03-11 DIAGNOSIS — I10 Essential (primary) hypertension: Secondary | ICD-10-CM | POA: Diagnosis not present

## 2016-03-11 DIAGNOSIS — N390 Urinary tract infection, site not specified: Secondary | ICD-10-CM | POA: Diagnosis not present

## 2016-03-11 DIAGNOSIS — N481 Balanitis: Secondary | ICD-10-CM | POA: Diagnosis not present

## 2016-03-11 DIAGNOSIS — Z6826 Body mass index (BMI) 26.0-26.9, adult: Secondary | ICD-10-CM | POA: Diagnosis not present

## 2016-03-11 DIAGNOSIS — E785 Hyperlipidemia, unspecified: Secondary | ICD-10-CM | POA: Diagnosis not present

## 2016-04-30 IMAGING — CR DG HAND COMPLETE 3+V*R*
3 series · 3 of 3 positions shown · non-contrast
Comparison: None.

CLINICAL DATA: Initial evaluation for pain distal right middle
finger after falling yesterday

EXAM:
RIGHT HAND - COMPLETE 3+ VIEW

[x hand pa right]
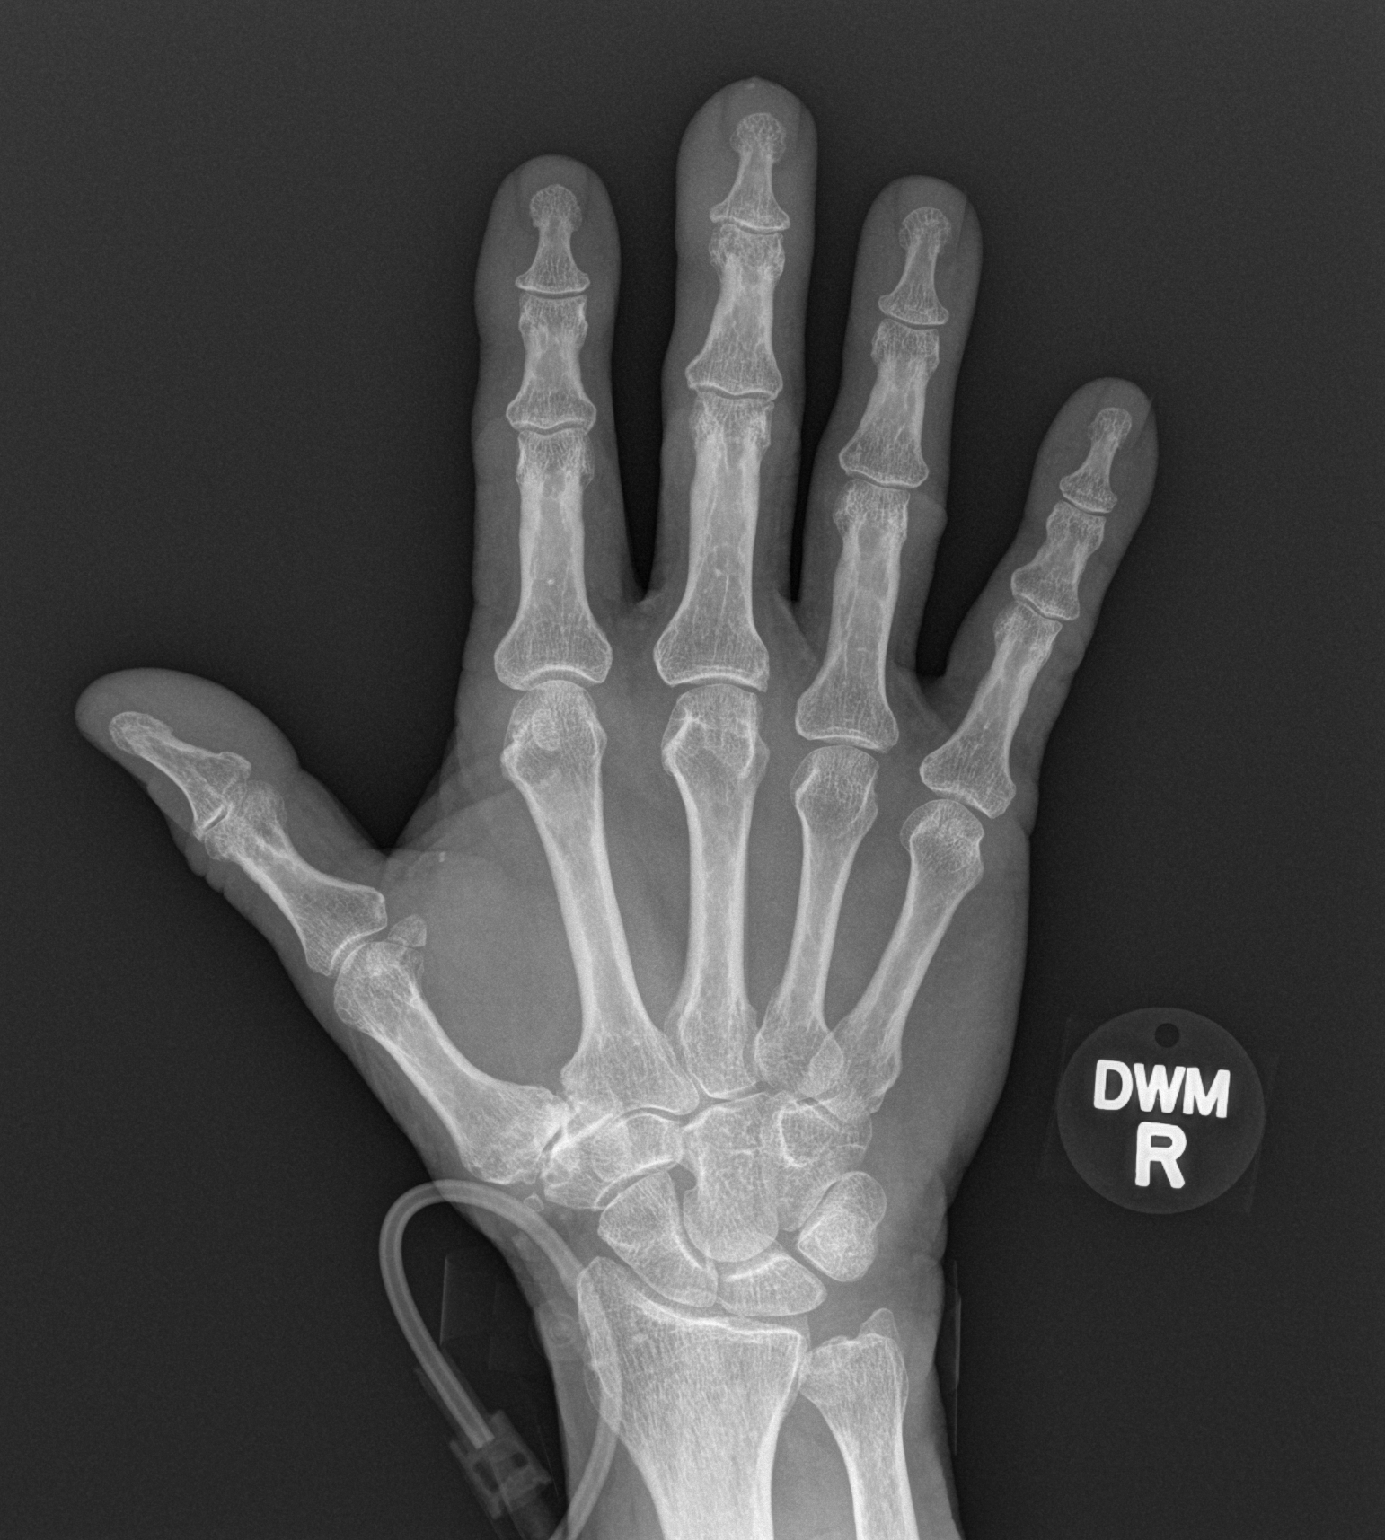

[x hand oblique right]
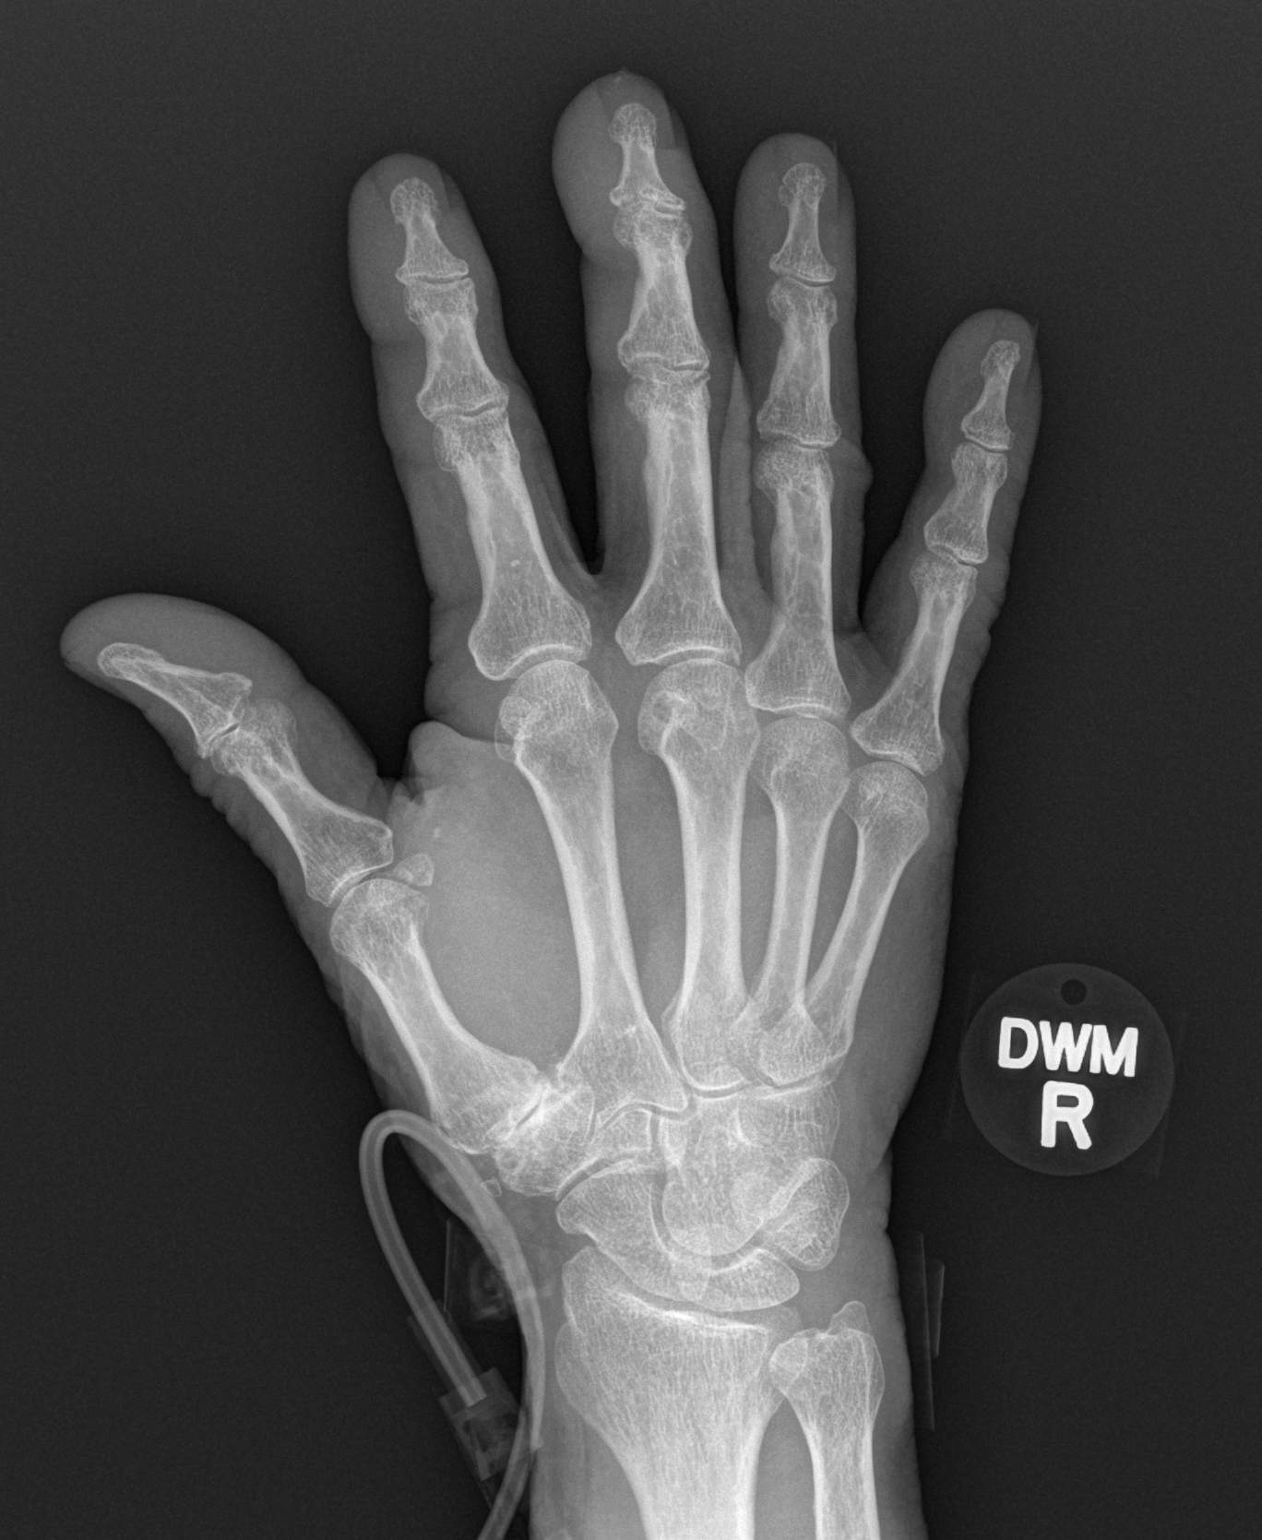

[x hand lat right]
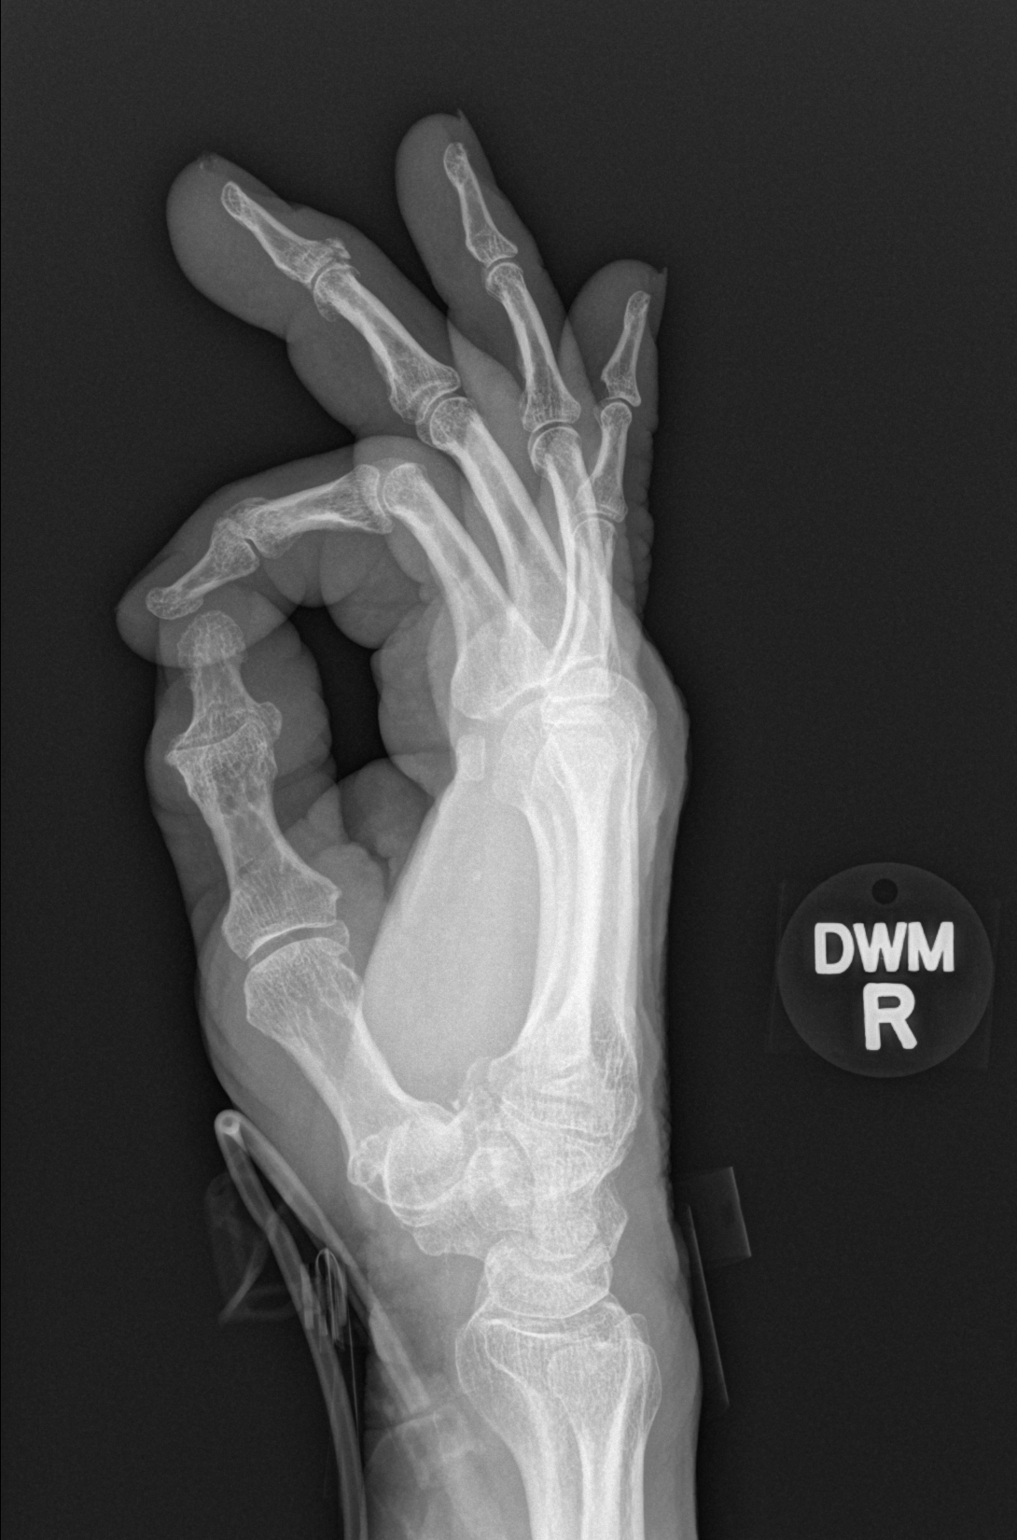

[3 of 3 positions shown; findings below may reference images not displayed]

FINDINGS: There is a fracture involving the dorsal base of the third distal
phalanx. It is minimally nipple placed with a fracture line
extending into the distal interphalangeal joint. There are no other
fractures.
IMPRESSION: Fracture distal phalanx

## 2016-05-11 DIAGNOSIS — Z6825 Body mass index (BMI) 25.0-25.9, adult: Secondary | ICD-10-CM | POA: Diagnosis not present

## 2016-05-11 DIAGNOSIS — L989 Disorder of the skin and subcutaneous tissue, unspecified: Secondary | ICD-10-CM | POA: Diagnosis not present

## 2016-05-11 DIAGNOSIS — L853 Xerosis cutis: Secondary | ICD-10-CM | POA: Diagnosis not present

## 2016-05-11 DIAGNOSIS — L659 Nonscarring hair loss, unspecified: Secondary | ICD-10-CM | POA: Diagnosis not present

## 2016-05-11 DIAGNOSIS — Z139 Encounter for screening, unspecified: Secondary | ICD-10-CM | POA: Diagnosis not present

## 2016-06-01 DIAGNOSIS — L739 Follicular disorder, unspecified: Secondary | ICD-10-CM | POA: Diagnosis not present

## 2016-06-01 DIAGNOSIS — C44311 Basal cell carcinoma of skin of nose: Secondary | ICD-10-CM | POA: Diagnosis not present

## 2016-06-01 DIAGNOSIS — L821 Other seborrheic keratosis: Secondary | ICD-10-CM | POA: Diagnosis not present

## 2016-06-01 DIAGNOSIS — R233 Spontaneous ecchymoses: Secondary | ICD-10-CM | POA: Diagnosis not present

## 2016-06-04 DIAGNOSIS — D519 Vitamin B12 deficiency anemia, unspecified: Secondary | ICD-10-CM | POA: Diagnosis not present

## 2016-06-04 DIAGNOSIS — E1140 Type 2 diabetes mellitus with diabetic neuropathy, unspecified: Secondary | ICD-10-CM | POA: Diagnosis not present

## 2016-06-04 DIAGNOSIS — E785 Hyperlipidemia, unspecified: Secondary | ICD-10-CM | POA: Diagnosis not present

## 2016-06-04 DIAGNOSIS — R42 Dizziness and giddiness: Secondary | ICD-10-CM | POA: Diagnosis not present

## 2016-06-04 DIAGNOSIS — H6123 Impacted cerumen, bilateral: Secondary | ICD-10-CM | POA: Diagnosis not present

## 2016-06-04 DIAGNOSIS — I10 Essential (primary) hypertension: Secondary | ICD-10-CM | POA: Diagnosis not present

## 2016-06-04 DIAGNOSIS — Z6825 Body mass index (BMI) 25.0-25.9, adult: Secondary | ICD-10-CM | POA: Diagnosis not present

## 2016-06-16 DIAGNOSIS — M25562 Pain in left knee: Secondary | ICD-10-CM | POA: Diagnosis not present

## 2016-06-16 DIAGNOSIS — R262 Difficulty in walking, not elsewhere classified: Secondary | ICD-10-CM | POA: Diagnosis not present

## 2016-06-16 DIAGNOSIS — M1712 Unilateral primary osteoarthritis, left knee: Secondary | ICD-10-CM | POA: Diagnosis not present

## 2016-06-17 DIAGNOSIS — Z23 Encounter for immunization: Secondary | ICD-10-CM | POA: Diagnosis not present

## 2016-06-17 DIAGNOSIS — M1712 Unilateral primary osteoarthritis, left knee: Secondary | ICD-10-CM | POA: Diagnosis not present

## 2016-06-17 DIAGNOSIS — Z6825 Body mass index (BMI) 25.0-25.9, adult: Secondary | ICD-10-CM | POA: Diagnosis not present

## 2016-06-30 DIAGNOSIS — M1712 Unilateral primary osteoarthritis, left knee: Secondary | ICD-10-CM | POA: Diagnosis not present

## 2016-06-30 DIAGNOSIS — M25561 Pain in right knee: Secondary | ICD-10-CM | POA: Diagnosis not present

## 2016-06-30 DIAGNOSIS — M25562 Pain in left knee: Secondary | ICD-10-CM | POA: Diagnosis not present

## 2016-07-07 DIAGNOSIS — M1712 Unilateral primary osteoarthritis, left knee: Secondary | ICD-10-CM | POA: Diagnosis not present

## 2016-07-07 DIAGNOSIS — M25562 Pain in left knee: Secondary | ICD-10-CM | POA: Diagnosis not present

## 2016-07-07 DIAGNOSIS — M25561 Pain in right knee: Secondary | ICD-10-CM | POA: Diagnosis not present

## 2016-07-12 DIAGNOSIS — N390 Urinary tract infection, site not specified: Secondary | ICD-10-CM | POA: Diagnosis not present

## 2016-07-12 DIAGNOSIS — Z6825 Body mass index (BMI) 25.0-25.9, adult: Secondary | ICD-10-CM | POA: Diagnosis not present

## 2016-07-14 DIAGNOSIS — M25562 Pain in left knee: Secondary | ICD-10-CM | POA: Diagnosis not present

## 2016-07-14 DIAGNOSIS — M1712 Unilateral primary osteoarthritis, left knee: Secondary | ICD-10-CM | POA: Diagnosis not present

## 2016-07-20 DIAGNOSIS — H11153 Pinguecula, bilateral: Secondary | ICD-10-CM | POA: Diagnosis not present

## 2016-07-20 DIAGNOSIS — H43813 Vitreous degeneration, bilateral: Secondary | ICD-10-CM | POA: Diagnosis not present

## 2016-07-20 DIAGNOSIS — E119 Type 2 diabetes mellitus without complications: Secondary | ICD-10-CM | POA: Diagnosis not present

## 2016-07-20 DIAGNOSIS — H25813 Combined forms of age-related cataract, bilateral: Secondary | ICD-10-CM | POA: Diagnosis not present

## 2016-07-21 DIAGNOSIS — M1712 Unilateral primary osteoarthritis, left knee: Secondary | ICD-10-CM | POA: Diagnosis not present

## 2016-07-21 DIAGNOSIS — M25562 Pain in left knee: Secondary | ICD-10-CM | POA: Diagnosis not present

## 2016-08-04 DIAGNOSIS — M25562 Pain in left knee: Secondary | ICD-10-CM | POA: Diagnosis not present

## 2016-08-04 DIAGNOSIS — M1712 Unilateral primary osteoarthritis, left knee: Secondary | ICD-10-CM | POA: Diagnosis not present

## 2016-10-02 DIAGNOSIS — R05 Cough: Secondary | ICD-10-CM | POA: Diagnosis not present

## 2016-10-02 DIAGNOSIS — J0140 Acute pansinusitis, unspecified: Secondary | ICD-10-CM | POA: Diagnosis not present

## 2016-10-06 DIAGNOSIS — H903 Sensorineural hearing loss, bilateral: Secondary | ICD-10-CM | POA: Diagnosis not present

## 2016-10-15 DIAGNOSIS — E1140 Type 2 diabetes mellitus with diabetic neuropathy, unspecified: Secondary | ICD-10-CM | POA: Diagnosis not present

## 2016-10-15 DIAGNOSIS — I10 Essential (primary) hypertension: Secondary | ICD-10-CM | POA: Diagnosis not present

## 2016-10-15 DIAGNOSIS — M26623 Arthralgia of bilateral temporomandibular joint: Secondary | ICD-10-CM | POA: Diagnosis not present

## 2016-10-15 DIAGNOSIS — H903 Sensorineural hearing loss, bilateral: Secondary | ICD-10-CM | POA: Diagnosis not present

## 2016-10-15 DIAGNOSIS — D519 Vitamin B12 deficiency anemia, unspecified: Secondary | ICD-10-CM | POA: Diagnosis not present

## 2016-11-18 DIAGNOSIS — E1140 Type 2 diabetes mellitus with diabetic neuropathy, unspecified: Secondary | ICD-10-CM | POA: Diagnosis not present

## 2016-11-18 DIAGNOSIS — N400 Enlarged prostate without lower urinary tract symptoms: Secondary | ICD-10-CM | POA: Diagnosis not present

## 2016-11-18 DIAGNOSIS — Z6826 Body mass index (BMI) 26.0-26.9, adult: Secondary | ICD-10-CM | POA: Diagnosis not present

## 2016-12-01 DIAGNOSIS — J342 Deviated nasal septum: Secondary | ICD-10-CM | POA: Diagnosis not present

## 2016-12-01 DIAGNOSIS — Z77122 Contact with and (suspected) exposure to noise: Secondary | ICD-10-CM | POA: Diagnosis not present

## 2016-12-01 DIAGNOSIS — H9313 Tinnitus, bilateral: Secondary | ICD-10-CM | POA: Diagnosis not present

## 2016-12-01 DIAGNOSIS — H903 Sensorineural hearing loss, bilateral: Secondary | ICD-10-CM | POA: Diagnosis not present

## 2016-12-01 DIAGNOSIS — H6123 Impacted cerumen, bilateral: Secondary | ICD-10-CM | POA: Diagnosis not present

## 2016-12-24 ENCOUNTER — Ambulatory Visit (INDEPENDENT_AMBULATORY_CARE_PROVIDER_SITE_OTHER): Payer: Medicare HMO

## 2016-12-24 ENCOUNTER — Ambulatory Visit (INDEPENDENT_AMBULATORY_CARE_PROVIDER_SITE_OTHER): Payer: Medicare HMO | Admitting: Orthopaedic Surgery

## 2016-12-24 ENCOUNTER — Encounter (INDEPENDENT_AMBULATORY_CARE_PROVIDER_SITE_OTHER): Payer: Self-pay | Admitting: Orthopaedic Surgery

## 2016-12-24 VITALS — BP 166/81 | HR 70 | Ht 68.0 in | Wt 165.0 lb

## 2016-12-24 DIAGNOSIS — M25562 Pain in left knee: Secondary | ICD-10-CM

## 2016-12-24 DIAGNOSIS — G8929 Other chronic pain: Secondary | ICD-10-CM | POA: Diagnosis not present

## 2016-12-24 DIAGNOSIS — M25561 Pain in right knee: Secondary | ICD-10-CM | POA: Diagnosis not present

## 2016-12-24 NOTE — Progress Notes (Signed)
Office Visit Note   Patient: Richard Randolph           Date of Birth: 27-Oct-1930           MRN: [id number] Visit Date: 12/24/2016              Requested by: Nicoletta Dress, MD [address] [place], [place] [address] PCP: Nicoletta Dress, MD   Assessment & Plan: Visit Diagnoses:  1. Chronic pain of left knee   2. Chronic pain of right knee   Recent fall 2 weeks ago with exacerbation of pain in both knees. Right total knee replacement looks fine without any problems. End-stage osteoarthritis of left knee without evidence of fracture  Plan: Continue with over-the-counter medicines as necessary, samples of Pennsaid, exercises as tolerated.  Follow-Up Instructions: Return if symptoms worsen or fail to improve.   Orders:  Orders Placed This Encounter  Procedures  . XR KNEE 3 VIEW RIGHT  . XR KNEE 3 VIEW LEFT   No orders of the defined types were placed in this encounter.     Procedures: No procedures performed   Clinical Data: No additional findings.   Subjective: Chief Complaint  Patient presents with  . Right Knee - Pain    Pt fell 2 weeks ago onto his buttocks, knees buckled  . Left Knee - Pain  Mr. Hornung relates falling 2 weeks ago on his "buttock". The time both knees "bent backwards". He still having some discomfort with both of his knees it was concerned that there may be a problem. He did not experience any ecchymosis or erythema. His skin has been intact. There is feeling much better and not using any ambulatory aid.  HPI  Review of Systems   Objective: Vital Signs: BP (!) 166/81   Pulse 70   Ht 5\' 8"  (1.727 m)   Wt 165 lb (74.8 kg)   BMI 25.09 kg/m   Physical Exam  Ortho Exam right knee without evidence of effusion. His incision is healed very nicely. No instability with a varus or valgus stress. Lacks just a few degrees to full extension and flexes over 105. No ecchymosis. No calf pain. No popliteal fullness. Neurovascular exam  intact distally.  Left knee exam with increased varus consistent with his end-stage osteoarthritis. Minimal medial joint pain. Positive patellar crepitation. Lacks about 10-12 to full extension and about 100 of flexion. No instability. No calf pain or distal edema. Fullness. Very small effusion. His not hot red or swollen.    Imaging: Xr Knee 3 View Left  Result Date: 12/24/2016 Films of the left knee were obtained in 3 projections standing. Films are consistent with end-stage osteoarthritis. There is bone-on-bone in the medial compartment with about 4-5 of varus. Gender changes are also identified at the patellofemoral joint and laterally. Decreased bone density  Xr Knee 3 View Right  Result Date: 12/24/2016 Films of the right total knee replacement were obtained in 3 projections. There were no complicating features. Alignment looks fine. Faction beneath on any of the components. Nice alignment.    PMFS History: Patient Active Problem List   Diagnosis Date Noted  . Hyperkalemia 01/01/2015  . Essential hypertension 01/01/2015  . Primary osteoarthritis of knee 12/31/2014  . Primary osteoarthritis of right knee 12/11/2014  . Preoperative clearance 11/29/2014  . RBBB 11/29/2014  . Knee pain 11/29/2014  . Heart murmur 11/29/2014   Past Medical History:  Diagnosis Date  . Anxiety   . Arthritis   .  Arthritis of knee   . Benign enlargement of prostate    reports that he was born with a large prostate  . Benign paroxysmal positional vertigo due to bilateral vestibular disorder   . Degenerative cervical disc   . Depression   . Diabetes mellitus without complication ([place])   . Elevated prostate specific antigen (PSA)   . Failure of erection   . Hyperkalemia   . Hyperlipidemia   . Hypertension   . Lumbar disc narrowing   . Osteoarthrosis   . Seizures ([place])    as a child age 83  . Type 2 diabetes mellitus ([place])   . Vitamin B12 deficiency   . Vitamin D deficiency     Family  History  Problem Relation Age of Onset  . Stroke Father     Past Surgical History:  Procedure Laterality Date  . KNEE CARTILAGE SURGERY Bilateral   . TOTAL KNEE ARTHROPLASTY Right 12/31/2014   Procedure: TOTAL KNEE ARTHROPLASTY;  Surgeon: Garald Balding, MD;  Location: [facility];  Service: Orthopedics;  Laterality: Right;   Social History   Occupational History  . Not on file.   Social History Main Topics  . Smoking status: Former Smoker    Types: Pipe    Quit date: 09/13/1988  . Smokeless tobacco: Current User    Types: Snuff  . Alcohol use No  . Drug use: No  . Sexual activity: Not on file

## 2016-12-31 DIAGNOSIS — H903 Sensorineural hearing loss, bilateral: Secondary | ICD-10-CM | POA: Diagnosis not present

## 2017-01-03 DIAGNOSIS — N401 Enlarged prostate with lower urinary tract symptoms: Secondary | ICD-10-CM | POA: Diagnosis not present

## 2017-01-03 DIAGNOSIS — R3914 Feeling of incomplete bladder emptying: Secondary | ICD-10-CM | POA: Diagnosis not present

## 2017-01-03 DIAGNOSIS — R972 Elevated prostate specific antigen [PSA]: Secondary | ICD-10-CM | POA: Diagnosis not present

## 2017-01-03 DIAGNOSIS — N5201 Erectile dysfunction due to arterial insufficiency: Secondary | ICD-10-CM | POA: Diagnosis not present

## 2017-01-07 DIAGNOSIS — Z6826 Body mass index (BMI) 26.0-26.9, adult: Secondary | ICD-10-CM | POA: Diagnosis not present

## 2017-01-07 DIAGNOSIS — J069 Acute upper respiratory infection, unspecified: Secondary | ICD-10-CM | POA: Diagnosis not present

## 2017-01-19 DIAGNOSIS — E1140 Type 2 diabetes mellitus with diabetic neuropathy, unspecified: Secondary | ICD-10-CM | POA: Diagnosis not present

## 2017-01-19 DIAGNOSIS — Z1389 Encounter for screening for other disorder: Secondary | ICD-10-CM | POA: Diagnosis not present

## 2017-01-19 DIAGNOSIS — D519 Vitamin B12 deficiency anemia, unspecified: Secondary | ICD-10-CM | POA: Diagnosis not present

## 2017-01-19 DIAGNOSIS — Z139 Encounter for screening, unspecified: Secondary | ICD-10-CM | POA: Diagnosis not present

## 2017-01-19 DIAGNOSIS — I10 Essential (primary) hypertension: Secondary | ICD-10-CM | POA: Diagnosis not present

## 2017-01-19 DIAGNOSIS — Z6825 Body mass index (BMI) 25.0-25.9, adult: Secondary | ICD-10-CM | POA: Diagnosis not present

## 2017-02-04 DIAGNOSIS — N5201 Erectile dysfunction due to arterial insufficiency: Secondary | ICD-10-CM | POA: Diagnosis not present

## 2017-02-04 DIAGNOSIS — R350 Frequency of micturition: Secondary | ICD-10-CM | POA: Diagnosis not present

## 2017-02-04 DIAGNOSIS — R3915 Urgency of urination: Secondary | ICD-10-CM | POA: Diagnosis not present

## 2017-02-04 DIAGNOSIS — R972 Elevated prostate specific antigen [PSA]: Secondary | ICD-10-CM | POA: Diagnosis not present

## 2017-02-04 DIAGNOSIS — N401 Enlarged prostate with lower urinary tract symptoms: Secondary | ICD-10-CM | POA: Diagnosis not present

## 2017-02-04 DIAGNOSIS — R351 Nocturia: Secondary | ICD-10-CM | POA: Diagnosis not present

## 2017-03-04 DIAGNOSIS — R2689 Other abnormalities of gait and mobility: Secondary | ICD-10-CM | POA: Diagnosis not present

## 2017-03-04 DIAGNOSIS — R2681 Unsteadiness on feet: Secondary | ICD-10-CM | POA: Diagnosis not present

## 2017-03-04 DIAGNOSIS — Z6826 Body mass index (BMI) 26.0-26.9, adult: Secondary | ICD-10-CM | POA: Diagnosis not present

## 2017-03-23 DIAGNOSIS — Z9181 History of falling: Secondary | ICD-10-CM | POA: Diagnosis not present

## 2017-03-23 DIAGNOSIS — Z96651 Presence of right artificial knee joint: Secondary | ICD-10-CM | POA: Diagnosis not present

## 2017-03-23 DIAGNOSIS — R293 Abnormal posture: Secondary | ICD-10-CM | POA: Diagnosis not present

## 2017-03-23 DIAGNOSIS — R2681 Unsteadiness on feet: Secondary | ICD-10-CM | POA: Diagnosis not present

## 2017-03-30 DIAGNOSIS — Z96651 Presence of right artificial knee joint: Secondary | ICD-10-CM | POA: Diagnosis not present

## 2017-03-30 DIAGNOSIS — R293 Abnormal posture: Secondary | ICD-10-CM | POA: Diagnosis not present

## 2017-03-30 DIAGNOSIS — R2681 Unsteadiness on feet: Secondary | ICD-10-CM | POA: Diagnosis not present

## 2017-03-30 DIAGNOSIS — Z9181 History of falling: Secondary | ICD-10-CM | POA: Diagnosis not present

## 2017-04-01 DIAGNOSIS — Z96651 Presence of right artificial knee joint: Secondary | ICD-10-CM | POA: Diagnosis not present

## 2017-04-01 DIAGNOSIS — Z9181 History of falling: Secondary | ICD-10-CM | POA: Diagnosis not present

## 2017-04-01 DIAGNOSIS — R2681 Unsteadiness on feet: Secondary | ICD-10-CM | POA: Diagnosis not present

## 2017-04-01 DIAGNOSIS — R293 Abnormal posture: Secondary | ICD-10-CM | POA: Diagnosis not present

## 2017-04-03 DIAGNOSIS — R609 Edema, unspecified: Secondary | ICD-10-CM | POA: Diagnosis not present

## 2017-04-03 DIAGNOSIS — R252 Cramp and spasm: Secondary | ICD-10-CM | POA: Diagnosis not present

## 2017-04-06 DIAGNOSIS — I10 Essential (primary) hypertension: Secondary | ICD-10-CM | POA: Diagnosis not present

## 2017-04-06 DIAGNOSIS — M79604 Pain in right leg: Secondary | ICD-10-CM | POA: Diagnosis not present

## 2017-04-06 DIAGNOSIS — Z6826 Body mass index (BMI) 26.0-26.9, adult: Secondary | ICD-10-CM | POA: Diagnosis not present

## 2017-04-06 DIAGNOSIS — M79661 Pain in right lower leg: Secondary | ICD-10-CM | POA: Diagnosis not present

## 2017-04-08 DIAGNOSIS — R2681 Unsteadiness on feet: Secondary | ICD-10-CM | POA: Diagnosis not present

## 2017-04-08 DIAGNOSIS — Z9181 History of falling: Secondary | ICD-10-CM | POA: Diagnosis not present

## 2017-04-08 DIAGNOSIS — Z96651 Presence of right artificial knee joint: Secondary | ICD-10-CM | POA: Diagnosis not present

## 2017-04-08 DIAGNOSIS — R293 Abnormal posture: Secondary | ICD-10-CM | POA: Diagnosis not present

## 2017-04-13 DIAGNOSIS — R2681 Unsteadiness on feet: Secondary | ICD-10-CM | POA: Diagnosis not present

## 2017-04-13 DIAGNOSIS — Z96651 Presence of right artificial knee joint: Secondary | ICD-10-CM | POA: Diagnosis not present

## 2017-04-13 DIAGNOSIS — M40204 Unspecified kyphosis, thoracic region: Secondary | ICD-10-CM | POA: Diagnosis not present

## 2017-04-13 DIAGNOSIS — M25562 Pain in left knee: Secondary | ICD-10-CM | POA: Diagnosis not present

## 2017-04-13 DIAGNOSIS — R293 Abnormal posture: Secondary | ICD-10-CM | POA: Diagnosis not present

## 2017-04-13 DIAGNOSIS — M6281 Muscle weakness (generalized): Secondary | ICD-10-CM | POA: Diagnosis not present

## 2017-04-15 DIAGNOSIS — R350 Frequency of micturition: Secondary | ICD-10-CM | POA: Diagnosis not present

## 2017-04-15 DIAGNOSIS — Z6826 Body mass index (BMI) 26.0-26.9, adult: Secondary | ICD-10-CM | POA: Diagnosis not present

## 2017-04-15 DIAGNOSIS — N401 Enlarged prostate with lower urinary tract symptoms: Secondary | ICD-10-CM | POA: Diagnosis not present

## 2017-04-19 DIAGNOSIS — E1140 Type 2 diabetes mellitus with diabetic neuropathy, unspecified: Secondary | ICD-10-CM | POA: Diagnosis not present

## 2017-04-19 DIAGNOSIS — I10 Essential (primary) hypertension: Secondary | ICD-10-CM | POA: Diagnosis not present

## 2017-04-19 DIAGNOSIS — B372 Candidiasis of skin and nail: Secondary | ICD-10-CM | POA: Diagnosis not present

## 2017-04-19 DIAGNOSIS — K1379 Other lesions of oral mucosa: Secondary | ICD-10-CM | POA: Diagnosis not present

## 2017-04-19 DIAGNOSIS — E785 Hyperlipidemia, unspecified: Secondary | ICD-10-CM | POA: Diagnosis not present

## 2017-04-19 DIAGNOSIS — D519 Vitamin B12 deficiency anemia, unspecified: Secondary | ICD-10-CM | POA: Diagnosis not present

## 2017-04-19 DIAGNOSIS — Z6826 Body mass index (BMI) 26.0-26.9, adult: Secondary | ICD-10-CM | POA: Diagnosis not present

## 2017-04-20 DIAGNOSIS — R293 Abnormal posture: Secondary | ICD-10-CM | POA: Diagnosis not present

## 2017-04-20 DIAGNOSIS — M40204 Unspecified kyphosis, thoracic region: Secondary | ICD-10-CM | POA: Diagnosis not present

## 2017-04-20 DIAGNOSIS — M25562 Pain in left knee: Secondary | ICD-10-CM | POA: Diagnosis not present

## 2017-04-20 DIAGNOSIS — M6281 Muscle weakness (generalized): Secondary | ICD-10-CM | POA: Diagnosis not present

## 2017-04-20 DIAGNOSIS — R2681 Unsteadiness on feet: Secondary | ICD-10-CM | POA: Diagnosis not present

## 2017-04-20 DIAGNOSIS — Z96651 Presence of right artificial knee joint: Secondary | ICD-10-CM | POA: Diagnosis not present

## 2017-04-22 DIAGNOSIS — M6281 Muscle weakness (generalized): Secondary | ICD-10-CM | POA: Diagnosis not present

## 2017-04-22 DIAGNOSIS — R293 Abnormal posture: Secondary | ICD-10-CM | POA: Diagnosis not present

## 2017-04-22 DIAGNOSIS — Z96651 Presence of right artificial knee joint: Secondary | ICD-10-CM | POA: Diagnosis not present

## 2017-04-22 DIAGNOSIS — M40204 Unspecified kyphosis, thoracic region: Secondary | ICD-10-CM | POA: Diagnosis not present

## 2017-04-22 DIAGNOSIS — R2681 Unsteadiness on feet: Secondary | ICD-10-CM | POA: Diagnosis not present

## 2017-04-22 DIAGNOSIS — M25562 Pain in left knee: Secondary | ICD-10-CM | POA: Diagnosis not present

## 2017-05-03 DIAGNOSIS — D510 Vitamin B12 deficiency anemia due to intrinsic factor deficiency: Secondary | ICD-10-CM | POA: Diagnosis not present

## 2017-05-17 DIAGNOSIS — K1379 Other lesions of oral mucosa: Secondary | ICD-10-CM | POA: Diagnosis not present

## 2017-05-18 DIAGNOSIS — K1370 Unspecified lesions of oral mucosa: Secondary | ICD-10-CM | POA: Diagnosis not present

## 2017-05-18 DIAGNOSIS — M279 Disease of jaws, unspecified: Secondary | ICD-10-CM | POA: Diagnosis not present

## 2017-05-25 DIAGNOSIS — K120 Recurrent oral aphthae: Secondary | ICD-10-CM | POA: Diagnosis not present

## 2017-05-25 DIAGNOSIS — K1370 Unspecified lesions of oral mucosa: Secondary | ICD-10-CM | POA: Diagnosis not present

## 2017-05-26 DIAGNOSIS — K1230 Oral mucositis (ulcerative), unspecified: Secondary | ICD-10-CM | POA: Diagnosis not present

## 2017-05-26 DIAGNOSIS — K1379 Other lesions of oral mucosa: Secondary | ICD-10-CM | POA: Diagnosis not present

## 2017-06-07 DIAGNOSIS — K120 Recurrent oral aphthae: Secondary | ICD-10-CM | POA: Diagnosis not present

## 2017-06-07 DIAGNOSIS — K1370 Unspecified lesions of oral mucosa: Secondary | ICD-10-CM | POA: Diagnosis not present

## 2017-06-10 DIAGNOSIS — R233 Spontaneous ecchymoses: Secondary | ICD-10-CM | POA: Diagnosis not present

## 2017-06-10 DIAGNOSIS — L299 Pruritus, unspecified: Secondary | ICD-10-CM | POA: Diagnosis not present

## 2017-06-10 DIAGNOSIS — L439 Lichen planus, unspecified: Secondary | ICD-10-CM | POA: Diagnosis not present

## 2017-06-13 DIAGNOSIS — R350 Frequency of micturition: Secondary | ICD-10-CM | POA: Diagnosis not present

## 2017-06-13 DIAGNOSIS — Z6826 Body mass index (BMI) 26.0-26.9, adult: Secondary | ICD-10-CM | POA: Diagnosis not present

## 2017-06-13 DIAGNOSIS — N401 Enlarged prostate with lower urinary tract symptoms: Secondary | ICD-10-CM | POA: Diagnosis not present

## 2017-06-29 DIAGNOSIS — N3941 Urge incontinence: Secondary | ICD-10-CM | POA: Diagnosis not present

## 2017-06-29 DIAGNOSIS — R339 Retention of urine, unspecified: Secondary | ICD-10-CM | POA: Diagnosis not present

## 2017-06-29 DIAGNOSIS — R350 Frequency of micturition: Secondary | ICD-10-CM | POA: Diagnosis not present

## 2017-07-05 DIAGNOSIS — L439 Lichen planus, unspecified: Secondary | ICD-10-CM | POA: Diagnosis not present

## 2017-07-05 DIAGNOSIS — C44219 Basal cell carcinoma of skin of left ear and external auricular canal: Secondary | ICD-10-CM | POA: Diagnosis not present

## 2017-07-20 DIAGNOSIS — R339 Retention of urine, unspecified: Secondary | ICD-10-CM | POA: Diagnosis not present

## 2017-07-20 DIAGNOSIS — N3941 Urge incontinence: Secondary | ICD-10-CM | POA: Diagnosis not present

## 2017-07-20 DIAGNOSIS — R350 Frequency of micturition: Secondary | ICD-10-CM | POA: Diagnosis not present

## 2017-07-27 DIAGNOSIS — I10 Essential (primary) hypertension: Secondary | ICD-10-CM | POA: Diagnosis not present

## 2017-07-27 DIAGNOSIS — Z9181 History of falling: Secondary | ICD-10-CM | POA: Diagnosis not present

## 2017-07-27 DIAGNOSIS — E1140 Type 2 diabetes mellitus with diabetic neuropathy, unspecified: Secondary | ICD-10-CM | POA: Diagnosis not present

## 2017-07-27 DIAGNOSIS — Z6827 Body mass index (BMI) 27.0-27.9, adult: Secondary | ICD-10-CM | POA: Diagnosis not present

## 2017-07-27 DIAGNOSIS — D519 Vitamin B12 deficiency anemia, unspecified: Secondary | ICD-10-CM | POA: Diagnosis not present

## 2017-07-27 DIAGNOSIS — E785 Hyperlipidemia, unspecified: Secondary | ICD-10-CM | POA: Diagnosis not present

## 2017-08-08 DIAGNOSIS — L304 Erythema intertrigo: Secondary | ICD-10-CM | POA: Diagnosis not present

## 2017-08-19 DIAGNOSIS — H353132 Nonexudative age-related macular degeneration, bilateral, intermediate dry stage: Secondary | ICD-10-CM | POA: Diagnosis not present

## 2017-08-19 DIAGNOSIS — H2513 Age-related nuclear cataract, bilateral: Secondary | ICD-10-CM | POA: Diagnosis not present

## 2017-08-19 DIAGNOSIS — H5203 Hypermetropia, bilateral: Secondary | ICD-10-CM | POA: Diagnosis not present

## 2017-08-19 DIAGNOSIS — E119 Type 2 diabetes mellitus without complications: Secondary | ICD-10-CM | POA: Diagnosis not present

## 2017-08-31 DIAGNOSIS — C44229 Squamous cell carcinoma of skin of left ear and external auricular canal: Secondary | ICD-10-CM | POA: Diagnosis not present

## 2017-09-16 DIAGNOSIS — H52209 Unspecified astigmatism, unspecified eye: Secondary | ICD-10-CM | POA: Diagnosis not present

## 2017-09-16 DIAGNOSIS — R339 Retention of urine, unspecified: Secondary | ICD-10-CM | POA: Diagnosis not present

## 2017-09-16 DIAGNOSIS — N3941 Urge incontinence: Secondary | ICD-10-CM | POA: Diagnosis not present

## 2017-09-16 DIAGNOSIS — R350 Frequency of micturition: Secondary | ICD-10-CM | POA: Diagnosis not present

## 2017-09-16 DIAGNOSIS — N401 Enlarged prostate with lower urinary tract symptoms: Secondary | ICD-10-CM | POA: Diagnosis not present

## 2017-09-16 DIAGNOSIS — N138 Other obstructive and reflux uropathy: Secondary | ICD-10-CM | POA: Diagnosis not present

## 2017-09-16 DIAGNOSIS — H5203 Hypermetropia, bilateral: Secondary | ICD-10-CM | POA: Diagnosis not present

## 2017-09-16 DIAGNOSIS — H524 Presbyopia: Secondary | ICD-10-CM | POA: Diagnosis not present

## 2017-09-21 DIAGNOSIS — L570 Actinic keratosis: Secondary | ICD-10-CM | POA: Diagnosis not present

## 2017-11-02 DIAGNOSIS — E785 Hyperlipidemia, unspecified: Secondary | ICD-10-CM | POA: Diagnosis not present

## 2017-11-02 DIAGNOSIS — Z1331 Encounter for screening for depression: Secondary | ICD-10-CM | POA: Diagnosis not present

## 2017-11-02 DIAGNOSIS — Z125 Encounter for screening for malignant neoplasm of prostate: Secondary | ICD-10-CM | POA: Diagnosis not present

## 2017-11-02 DIAGNOSIS — Z0000 Encounter for general adult medical examination without abnormal findings: Secondary | ICD-10-CM | POA: Diagnosis not present

## 2017-11-02 DIAGNOSIS — Z136 Encounter for screening for cardiovascular disorders: Secondary | ICD-10-CM | POA: Diagnosis not present

## 2017-11-02 DIAGNOSIS — Z9181 History of falling: Secondary | ICD-10-CM | POA: Diagnosis not present

## 2017-11-19 DIAGNOSIS — C44329 Squamous cell carcinoma of skin of other parts of face: Secondary | ICD-10-CM | POA: Diagnosis not present

## 2017-11-23 DIAGNOSIS — N401 Enlarged prostate with lower urinary tract symptoms: Secondary | ICD-10-CM | POA: Diagnosis not present

## 2017-11-23 DIAGNOSIS — N138 Other obstructive and reflux uropathy: Secondary | ICD-10-CM | POA: Diagnosis not present

## 2017-11-23 DIAGNOSIS — R350 Frequency of micturition: Secondary | ICD-10-CM | POA: Diagnosis not present

## 2017-11-23 DIAGNOSIS — N3941 Urge incontinence: Secondary | ICD-10-CM | POA: Diagnosis not present

## 2017-12-28 DIAGNOSIS — R350 Frequency of micturition: Secondary | ICD-10-CM | POA: Diagnosis not present

## 2017-12-28 DIAGNOSIS — N401 Enlarged prostate with lower urinary tract symptoms: Secondary | ICD-10-CM | POA: Diagnosis not present

## 2017-12-28 DIAGNOSIS — N138 Other obstructive and reflux uropathy: Secondary | ICD-10-CM | POA: Diagnosis not present

## 2017-12-28 DIAGNOSIS — N3941 Urge incontinence: Secondary | ICD-10-CM | POA: Diagnosis not present

## 2018-01-27 DIAGNOSIS — E663 Overweight: Secondary | ICD-10-CM | POA: Diagnosis not present

## 2018-01-27 DIAGNOSIS — Z6827 Body mass index (BMI) 27.0-27.9, adult: Secondary | ICD-10-CM | POA: Diagnosis not present

## 2018-01-27 DIAGNOSIS — F329 Major depressive disorder, single episode, unspecified: Secondary | ICD-10-CM | POA: Diagnosis not present

## 2018-02-15 DIAGNOSIS — I10 Essential (primary) hypertension: Secondary | ICD-10-CM | POA: Diagnosis not present

## 2018-02-15 DIAGNOSIS — Z6827 Body mass index (BMI) 27.0-27.9, adult: Secondary | ICD-10-CM | POA: Diagnosis not present

## 2018-02-15 DIAGNOSIS — E1140 Type 2 diabetes mellitus with diabetic neuropathy, unspecified: Secondary | ICD-10-CM | POA: Diagnosis not present

## 2018-02-15 DIAGNOSIS — D519 Vitamin B12 deficiency anemia, unspecified: Secondary | ICD-10-CM | POA: Diagnosis not present

## 2018-02-15 DIAGNOSIS — F329 Major depressive disorder, single episode, unspecified: Secondary | ICD-10-CM | POA: Diagnosis not present

## 2018-02-23 ENCOUNTER — Ambulatory Visit: Payer: Self-pay | Admitting: Podiatry

## 2018-03-03 ENCOUNTER — Encounter: Payer: Self-pay | Admitting: Sports Medicine

## 2018-03-03 ENCOUNTER — Ambulatory Visit: Payer: Medicare HMO | Admitting: Sports Medicine

## 2018-03-03 VITALS — BP 132/73 | HR 77 | Temp 98.5°F | Resp 16 | Ht 68.0 in | Wt 170.0 lb

## 2018-03-03 DIAGNOSIS — M2040 Other hammer toe(s) (acquired), unspecified foot: Secondary | ICD-10-CM

## 2018-03-03 DIAGNOSIS — M79674 Pain in right toe(s): Secondary | ICD-10-CM

## 2018-03-03 DIAGNOSIS — M2142 Flat foot [pes planus] (acquired), left foot: Secondary | ICD-10-CM | POA: Diagnosis not present

## 2018-03-03 DIAGNOSIS — M79675 Pain in left toe(s): Secondary | ICD-10-CM

## 2018-03-03 DIAGNOSIS — M2141 Flat foot [pes planus] (acquired), right foot: Secondary | ICD-10-CM | POA: Diagnosis not present

## 2018-03-03 DIAGNOSIS — E119 Type 2 diabetes mellitus without complications: Secondary | ICD-10-CM | POA: Diagnosis not present

## 2018-03-03 DIAGNOSIS — B351 Tinea unguium: Secondary | ICD-10-CM | POA: Diagnosis not present

## 2018-03-03 DIAGNOSIS — L601 Onycholysis: Secondary | ICD-10-CM

## 2018-03-03 NOTE — Progress Notes (Signed)
Subjective: Richard Randolph is a 84 y.o. male patient with history of diabetes who presents to office today complaining of long,mildly painful nails  while ambulating in shoes; unable to trim. Patient states that the left great toenail is very thick and discolored and feels like a another nail is growing on top of it states that it has been this way for at least the last 3 months especially after he suffered with a stroke and noticed that his gait was changing.  States that he has used Epson salt and triple antibiotic with minimal improvement.  Reports that he does not know his blood sugar for today and that his last A1c was 6 and saw his primary care doctor Dr. Claudie Revering a couple of weeks ago.    Review of Systems  All other systems reviewed and are negative.    Patient Active Problem List   Diagnosis Date Noted  . Hyperkalemia 01/01/2015  . Essential hypertension 01/01/2015  . Primary osteoarthritis of knee 12/31/2014  . Primary osteoarthritis of right knee 12/11/2014  . Preoperative clearance 11/29/2014  . RBBB 11/29/2014  . Knee pain 11/29/2014  . Heart murmur 11/29/2014   Current Outpatient Medications on File Prior to Visit  Medication Sig Dispense Refill  . Cyanocobalamin (VITAMIN B-12 PO) Place 1 tablet under the tongue daily at 12 noon.    Marland Kitchen glipiZIDE (GLUCOTROL) 5 MG tablet Take 5 mg by mouth 2 (two) times daily before a meal.    . LEVEMIR FLEXTOUCH 100 UNIT/ML Pen Inject 25 Units into the skin at bedtime.      No current facility-administered medications on file prior to visit.    Allergies  Allergen Reactions  . Ciprofloxacin Other (See Comments)    dizzy    No results found for this or any previous visit (from the past 2160 hour(s)).  Objective: General: Patient is awake, alert, and oriented x 3 and in no acute distress.  Integument: Skin is warm, dry and supple bilateral. Nails are tender, long, thickened and  dystrophic with subungual debris, consistent with  onychomycosis, 1-5 bilateral.  There is distal lifting of the left great toenail with significant fungus and dried blood underneath the nail however there is no surrounding signs of infection to the toe or toe nailbed. No open lesions or preulcerative lesions present bilateral. Remaining integument unremarkable.  Vasculature:  Dorsalis Pedis pulse 1/4 bilateral. Posterior Tibial pulse  1/4 bilateral.  Capillary fill time <5 sec 1-5 bilateral.  Scant hair growth to the level of the digits. Temperature gradient within normal limits.  Mild varicosities present bilateral. No edema present bilateral.   Neurology: The patient has intact sensation measured with a 5.07/10g Semmes Weinstein Monofilament at all pedal sites bilateral . Vibratory sensation diminished bilateral with tuning fork. No Babinski sign present bilateral.   Musculoskeletal: Asymptomatic pes planus and hammertoe pedal deformities noted bilateral. Muscular strength 5/5 in all lower extremity muscular groups bilateral without pain on range of motion . No tenderness with calf compression bilateral.  Assessment and Plan: Problem List Items Addressed This Visit    None    Visit Diagnoses    Pain due to onychomycosis of toenails of both feet    -  Primary   Onycholysis       Left great toenail   Diabetes mellitus without complication (HCC)       Hammer toe, unspecified laterality       Pes planus of both feet         -  Examined patient. -Discussed and educated patient on diabetic foot care, especially with  regards to the vascular, neurological and musculoskeletal systems.  -Stressed the importance of good glycemic control and the detriment of not  controlling glucose levels in relation to the foot. -Mechanically debrided all nails 1-5 bilateral using sterile nail nipper and filed with dremel without incident  -Safe step diabetic shoe order form was completed; office to contact primary care for approval / certification;  Office to  arrange shoe fitting and dispensing. -Answered all patient questions -Patient to return  in 3 months for at risk foot care -Patient advised to call the office if any problems or questions arise in the meantime.  Landis Martins, DPM

## 2018-03-03 NOTE — Progress Notes (Signed)
   Subjective:    [name], male    DOB: 04-Jun-1931, 84 y.o.   MRN: [id number]  HPI    Review of Systems  All other systems reviewed and are negative.      Objective:   Physical Exam        Assessment & Plan:

## 2018-03-23 ENCOUNTER — Ambulatory Visit: Payer: Medicare HMO | Admitting: *Deleted

## 2018-03-23 DIAGNOSIS — E119 Type 2 diabetes mellitus without complications: Secondary | ICD-10-CM

## 2018-04-11 DIAGNOSIS — L720 Epidermal cyst: Secondary | ICD-10-CM | POA: Diagnosis not present

## 2018-04-11 DIAGNOSIS — R233 Spontaneous ecchymoses: Secondary | ICD-10-CM | POA: Diagnosis not present

## 2018-04-11 DIAGNOSIS — L578 Other skin changes due to chronic exposure to nonionizing radiation: Secondary | ICD-10-CM | POA: Diagnosis not present

## 2018-04-11 DIAGNOSIS — L570 Actinic keratosis: Secondary | ICD-10-CM | POA: Diagnosis not present

## 2018-05-31 ENCOUNTER — Ambulatory Visit: Payer: Medicare HMO | Admitting: Sports Medicine

## 2018-05-31 ENCOUNTER — Encounter: Payer: Self-pay | Admitting: Sports Medicine

## 2018-05-31 DIAGNOSIS — E119 Type 2 diabetes mellitus without complications: Secondary | ICD-10-CM

## 2018-05-31 DIAGNOSIS — M2142 Flat foot [pes planus] (acquired), left foot: Secondary | ICD-10-CM

## 2018-05-31 DIAGNOSIS — M2141 Flat foot [pes planus] (acquired), right foot: Secondary | ICD-10-CM

## 2018-05-31 DIAGNOSIS — M2040 Other hammer toe(s) (acquired), unspecified foot: Secondary | ICD-10-CM

## 2018-05-31 NOTE — Patient Instructions (Signed)

## 2018-05-31 NOTE — Progress Notes (Signed)
Patient presents office for pickup of diabetic shoes.  Diabetic shoes were dispensed to patient with appropriate fit providing adequate support patient was able to ambulate 10 feet without pain problems or issues.  Patient was given instructions on how to properly break in diabetic shoes.  At no charge mechanically divided both hallux toenails and smooth with rotary bur without incident.  Patient to follow-up as scheduled for continued diabetic foot care.  Advised patient if he prefers to have balance braces we can further discuss this at his next visit or he can make an appointment for 1 month to be measured for those with Benjie Karvonen.  -Dr.Bradin Mcadory

## 2018-06-15 ENCOUNTER — Other Ambulatory Visit: Payer: Medicare HMO

## 2018-07-28 NOTE — Progress Notes (Signed)
[name], male   DOB: Nov 07, 1930, 84 y.o.   MRN: [id number]   Patient presents at Dr Leeanne Rio request to be measured for diabetic shoes and inserts with [facility] Certified Pedorthist.  Patient will be called when shoes and inserts arrive to schedule a fitting.

## 2018-08-03 DIAGNOSIS — I10 Essential (primary) hypertension: Secondary | ICD-10-CM | POA: Diagnosis not present

## 2018-08-03 DIAGNOSIS — E1140 Type 2 diabetes mellitus with diabetic neuropathy, unspecified: Secondary | ICD-10-CM | POA: Diagnosis not present

## 2018-08-03 DIAGNOSIS — D519 Vitamin B12 deficiency anemia, unspecified: Secondary | ICD-10-CM | POA: Diagnosis not present

## 2018-08-03 DIAGNOSIS — E785 Hyperlipidemia, unspecified: Secondary | ICD-10-CM | POA: Diagnosis not present

## 2018-08-30 ENCOUNTER — Ambulatory Visit: Payer: Medicare HMO | Admitting: Sports Medicine

## 2018-11-14 DIAGNOSIS — E785 Hyperlipidemia, unspecified: Secondary | ICD-10-CM | POA: Diagnosis not present

## 2018-11-14 DIAGNOSIS — E1140 Type 2 diabetes mellitus with diabetic neuropathy, unspecified: Secondary | ICD-10-CM | POA: Diagnosis not present

## 2018-11-14 DIAGNOSIS — D519 Vitamin B12 deficiency anemia, unspecified: Secondary | ICD-10-CM | POA: Diagnosis not present

## 2018-11-14 DIAGNOSIS — I10 Essential (primary) hypertension: Secondary | ICD-10-CM | POA: Diagnosis not present

## 2018-11-14 DIAGNOSIS — F329 Major depressive disorder, single episode, unspecified: Secondary | ICD-10-CM | POA: Diagnosis not present

## 2018-11-23 ENCOUNTER — Ambulatory Visit (INDEPENDENT_AMBULATORY_CARE_PROVIDER_SITE_OTHER): Payer: Medicare HMO

## 2018-11-23 ENCOUNTER — Ambulatory Visit (INDEPENDENT_AMBULATORY_CARE_PROVIDER_SITE_OTHER): Payer: Medicare HMO | Admitting: Orthopaedic Surgery

## 2018-11-23 ENCOUNTER — Encounter (INDEPENDENT_AMBULATORY_CARE_PROVIDER_SITE_OTHER): Payer: Self-pay | Admitting: Orthopaedic Surgery

## 2018-11-23 ENCOUNTER — Other Ambulatory Visit: Payer: Self-pay

## 2018-11-23 DIAGNOSIS — R3981 Functional urinary incontinence: Secondary | ICD-10-CM

## 2018-11-23 DIAGNOSIS — G8929 Other chronic pain: Secondary | ICD-10-CM | POA: Diagnosis not present

## 2018-11-23 DIAGNOSIS — M25562 Pain in left knee: Secondary | ICD-10-CM

## 2018-11-23 DIAGNOSIS — I10 Essential (primary) hypertension: Secondary | ICD-10-CM

## 2018-11-23 DIAGNOSIS — M1712 Unilateral primary osteoarthritis, left knee: Secondary | ICD-10-CM

## 2018-11-23 DIAGNOSIS — M170 Bilateral primary osteoarthritis of knee: Secondary | ICD-10-CM | POA: Diagnosis not present

## 2018-11-23 DIAGNOSIS — E785 Hyperlipidemia, unspecified: Secondary | ICD-10-CM | POA: Diagnosis not present

## 2018-11-23 DIAGNOSIS — E119 Type 2 diabetes mellitus without complications: Secondary | ICD-10-CM | POA: Diagnosis not present

## 2018-11-23 MED ORDER — METHYLPREDNISOLONE ACETATE 40 MG/ML IJ SUSP
80.0000 mg | INTRAMUSCULAR | Status: AC | PRN
  Administered 2018-11-23: 80 mg via INTRA_ARTICULAR

## 2018-11-23 MED ORDER — LIDOCAINE HCL 1 % IJ SOLN
2.0000 mL | INTRAMUSCULAR | Status: AC | PRN
  Administered 2018-11-23: 2 mL

## 2018-11-23 MED ORDER — BUPIVACAINE HCL 0.5 % IJ SOLN
2.0000 mL | INTRAMUSCULAR | Status: AC | PRN
  Administered 2018-11-23: 2 mL via INTRA_ARTICULAR

## 2018-11-23 NOTE — Progress Notes (Signed)
Office Visit Note   Patient: Richard Randolph           Date of Birth: [birth year]           MRN: [id number] Visit Date: 11/23/2018              Requested by: Richard Dress, MD Richard Randolph, [place] [address] PCP: Richard Dress, MD   Assessment & Plan: Visit Diagnoses:  1. Chronic pain of left knee   2. Bilateral primary osteoarthritis of knee     Plan: Over years status post successful primary right total knee replacement.  Presently having symptoms in the left knee consistent with end-stage osteoarthritis.  Long discussion over 30 minutes regarding present problem and treatment options.  Richard Randolph has history of diabetes and other underlying medical comorbidities.  I am not sure at this point that he would be a candidate for knee replacement.  Therefore, discussed nonoperative treatment options.  Will inject the left knee with cortisone and asked him to monitor his blood sugars.  Also apply spider brace for support.  Follow-up as needed  Follow-Up Instructions: Return if symptoms worsen or fail to improve.   Orders:  Orders Placed This Encounter  Procedures  . XR KNEE 3 VIEW LEFT   No orders of the defined types were placed in this encounter.     Procedures: Large Joint Inj: L knee on 11/23/2018 10:36 AM Indications: pain and diagnostic evaluation Details: 25 G 1.5 in needle, anteromedial approach  Arthrogram: No  Medications: 2 mL lidocaine 1 %; 2 mL bupivacaine 0.5 %; 80 mg methylPREDNISolone acetate 40 MG/ML Procedure, treatment alternatives, risks and benefits explained, specific risks discussed. Consent was given by the patient. Patient was prepped and draped in the usual sterile fashion.       Clinical Data: No additional findings.   Subjective: No chief complaint on file. Richard Randolph is 85 years old and visits the office for evaluation of left knee pain.  He is status post right total knee replacement several years ago and notes that  he has "done well".  He has a number of medical comorbidities including diabetes and poor bladder control and notes that he has been having a lot of trouble with his left knee to the point of compromise.  He has had difficulty with pain, stiffness and a feeling of his knee giving way.  He does use a walker at home  HPI  Review of Systems   Objective: Vital Signs: There were no vitals taken for this visit.  Physical Exam Constitutional:      Appearance: He is well-developed.  Eyes:     Pupils: Pupils are equal, round, and reactive to light.  Pulmonary:     Effort: Pulmonary effort is normal.  Skin:    General: Skin is warm and dry.  Neurological:     Mental Status: He is alert and oriented to person, place, and time.  Psychiatric:        Behavior: Behavior normal.     Ortho Exam awake alert and oriented x3.  I evaluated him in a wheelchair although he came to the office under his own ambulation accompanied by his wife.  He lacked about 10 to 12 degrees of full knee extension on the right flexion about 95 degrees.  His knee was not hot warm red or swollen and no instability.  No localized areas of tenderness.  Left knee lacked about 25 degrees to  full extension and flexed probably 100 degrees.  No instability.  Left foot was warm but did not feel any pulses.  No edema.  Minimal effusion  Specialty Comments:  No specialty comments available.  Imaging: No results found.   PMFS History: Patient Active Problem List   Diagnosis Date Noted  . Bilateral primary osteoarthritis of knee 11/23/2018  . Hyperkalemia 01/01/2015  . Essential hypertension 01/01/2015  . Primary osteoarthritis of knee 12/31/2014  . Primary osteoarthritis of right knee 12/11/2014  . Preoperative clearance 11/29/2014  . RBBB 11/29/2014  . Knee pain 11/29/2014  . Heart murmur 11/29/2014   Past Medical History:  Diagnosis Date  . Anxiety   . Arthritis   . Arthritis of knee   . Benign enlargement of  prostate    reports that he was born with a large prostate  . Benign paroxysmal positional vertigo due to bilateral vestibular disorder   . Degenerative cervical disc   . Depression   . Diabetes mellitus without complication ([place])   . Elevated prostate specific antigen (PSA)   . Failure of erection   . Hyperkalemia   . Hyperlipidemia   . Hypertension   . Lumbar disc narrowing   . Osteoarthrosis   . Seizures ([place])    as a child age 82  . Type 2 diabetes mellitus ([place])   . Vitamin B12 deficiency   . Vitamin D deficiency     Family History  Problem Relation Age of Onset  . Stroke Father     Past Surgical History:  Procedure Laterality Date  . KNEE CARTILAGE SURGERY Bilateral   . TOTAL KNEE ARTHROPLASTY Right 12/31/2014   Procedure: TOTAL KNEE ARTHROPLASTY;  Surgeon: Richard Balding, MD;  Location: [facility];  Service: Orthopedics;  Laterality: Right;   Social History   Occupational History  . Not on file  Tobacco Use  . Smoking status: Former Smoker    Types: Pipe    Last attempt to quit: 09/13/1988    Years since quitting: 30.2  . Smokeless tobacco: Current User    Types: Snuff  Substance and Sexual Activity  . Alcohol use: No  . Drug use: No  . Sexual activity: Not on file     Richard Balding, MD   Note - This record has been created using [employer].  Chart creation errors have been sought, but may not always  have been located. Such creation errors do not reflect on  the standard of medical care.

## 2018-11-24 ENCOUNTER — Ambulatory Visit: Payer: Medicare HMO | Admitting: Sports Medicine

## 2018-12-27 DIAGNOSIS — Z23 Encounter for immunization: Secondary | ICD-10-CM | POA: Diagnosis not present

## 2018-12-27 DIAGNOSIS — Z9181 History of falling: Secondary | ICD-10-CM | POA: Diagnosis not present

## 2018-12-27 DIAGNOSIS — N481 Balanitis: Secondary | ICD-10-CM | POA: Diagnosis not present

## 2018-12-27 DIAGNOSIS — Z0000 Encounter for general adult medical examination without abnormal findings: Secondary | ICD-10-CM | POA: Diagnosis not present

## 2018-12-27 DIAGNOSIS — Z136 Encounter for screening for cardiovascular disorders: Secondary | ICD-10-CM | POA: Diagnosis not present

## 2018-12-27 DIAGNOSIS — Z1339 Encounter for screening examination for other mental health and behavioral disorders: Secondary | ICD-10-CM | POA: Diagnosis not present

## 2018-12-27 DIAGNOSIS — Z1331 Encounter for screening for depression: Secondary | ICD-10-CM | POA: Diagnosis not present

## 2018-12-27 DIAGNOSIS — E785 Hyperlipidemia, unspecified: Secondary | ICD-10-CM | POA: Diagnosis not present

## 2019-01-29 ENCOUNTER — Telehealth: Payer: Self-pay | Admitting: Orthopaedic Surgery

## 2019-01-29 NOTE — Telephone Encounter (Signed)
Patient left a voicemail requesting a return call to discuss his knee surgery.

## 2019-01-29 NOTE — Telephone Encounter (Signed)
Please advise 

## 2019-01-29 NOTE — Telephone Encounter (Signed)
He has recently fell and is having increasing pain.  Injection not beneficial with recent fall.  Told to schedule return office visit with Dr Durward Fortes

## 2019-01-30 ENCOUNTER — Ambulatory Visit: Payer: Medicare HMO | Admitting: Orthopedic Surgery

## 2019-01-30 ENCOUNTER — Ambulatory Visit (INDEPENDENT_AMBULATORY_CARE_PROVIDER_SITE_OTHER): Payer: Medicare HMO

## 2019-01-30 ENCOUNTER — Other Ambulatory Visit: Payer: Self-pay

## 2019-01-30 ENCOUNTER — Encounter: Payer: Self-pay | Admitting: Orthopedic Surgery

## 2019-01-30 VITALS — BP 196/83 | HR 101 | Resp 18 | Ht 68.0 in | Wt 170.0 lb

## 2019-01-30 DIAGNOSIS — T8482XD Fibrosis due to internal orthopedic prosthetic devices, implants and grafts, subsequent encounter: Secondary | ICD-10-CM

## 2019-01-30 DIAGNOSIS — M25562 Pain in left knee: Secondary | ICD-10-CM

## 2019-01-30 DIAGNOSIS — Z96651 Presence of right artificial knee joint: Secondary | ICD-10-CM | POA: Diagnosis not present

## 2019-01-30 DIAGNOSIS — M1712 Unilateral primary osteoarthritis, left knee: Secondary | ICD-10-CM

## 2019-01-30 NOTE — Progress Notes (Signed)
Office Visit Note   Patient: Richard Randolph           Date of Birth: Jun 03, 1931           MRN: [id number] Visit Date: 01/30/2019              Requested by: Nicoletta Dress, MD [place], [place] [address] PCP: Nicoletta Dress, MD   Assessment & Plan: Visit Diagnoses:  1. Acute pain of left knee   2. Unilateral primary osteoarthritis, left knee   3. Total knee replacement status, right   4. Arthrofibrosis of total knee replacement, subsequent encounter     Plan:  #1: Prescription for a new walker was given since he bent his other one. #2: Home health physical therapy to be ordered #3: Have him see his medical doctor for his hypertension today  Follow-Up Instructions: Return in about 3 weeks (around 02/20/2019).   Orders:  Orders Placed This Encounter  Procedures  . Walker rolling  . XR Knee Complete 4 Views Left   No orders of the defined types were placed in this encounter.     Procedures: No procedures performed   Clinical Data: No additional findings.   Subjective: Chief Complaint  Patient presents with  . Left Knee - Injury   HPI Mr. More is a 85 year old male who fell on his walker a month ago while walking down the stairs and fell into his walker and actually bent the top bar slightly. He crawled into the house and with assistance from 2 other men and they were able to get him upright.  He is having difficulty walking and some swelling. States the feels very unstable and he feels like he is going to fall. He though continues to ambulate but has been progressively getting weaker. Advil helps his pain. He is diabetic. He has never had surgery to his left knee.  Right total knee replacement 4 years ago.   Review of Systems  Constitutional: Positive for fatigue.  HENT: Negative for trouble swallowing.   Eyes: Positive for pain.  Respiratory: Positive for shortness of breath.   Cardiovascular: Positive for leg swelling.   Gastrointestinal: Negative for constipation.  Endocrine: Negative for cold intolerance.  Genitourinary: Negative for difficulty urinating.  Musculoskeletal: Positive for gait problem and joint swelling.  Skin: Negative for rash.  Allergic/Immunologic: Negative for food allergies.  Neurological: Positive for weakness.  Hematological: Bruises/bleeds easily.  Psychiatric/Behavioral: Negative for sleep disturbance.     Objective: Vital Signs: BP (!) 196/83 (BP Location: Right Arm, Patient Position: Sitting, Cuff Size: Normal)   Pulse (!) 101   Resp 18   Ht 5\' 8"  (1.727 m)   Wt 170 lb (77.1 kg)   BMI 25.85 kg/m   Physical Exam Constitutional:      Appearance: Normal appearance.  HENT:     Head: Normocephalic.     Nose: Nose normal.     Mouth/Throat:     Mouth: Mucous membranes are moist.  Eyes:     Pupils: Pupils are equal, round, and reactive to light.  Musculoskeletal:     Comments: See below   Neurological:     General: No focal deficit present.     Mental Status: He is alert and oriented to person, place, and time.  Psychiatric:        Mood and Affect: Mood normal.        Behavior: Behavior normal.     Ortho  Exam  Exam today of the left knee reveals range of motion from around.  Does have some pseudolaxity with valgus stressing.  There is a good endpoint.  Crepitance with range of motion.  Trace effusion.  No warmth or erythema.  Range of motion from 25 degrees to 80 degrees. He is able to hold against resistance.   Specialty Comments:  No specialty comments available.  Imaging: Xr Knee Complete 4 Views Left  Result Date: 01/30/2019 Chondrocalcinosis is noted in the lateral joint space.  4 view x-ray of the left knee reveals medial compartment to be bone-on-bone.  Periarticular spurring in all 3 compartments.  Calcification in the vessels posteriorly are noted.  Cannot rule out a nondisplaced fracture.  But then again it has been over 1 month since his fall.     PMFS History: Current Outpatient Medications  Medication Sig Dispense Refill  . alfuzosin (UROXATRAL) 10 MG 24 hr tablet Take by mouth.    . Cyanocobalamin (VITAMIN B-12 PO) Place 1 tablet under the tongue daily at 12 noon.    . fluconazole (DIFLUCAN) 150 MG tablet Take 150 mg by mouth daily.    Marland Kitchen glipiZIDE (GLUCOTROL) 5 MG tablet Take 5 mg by mouth 2 (two) times daily before a meal.    . lisinopril (ZESTRIL) 20 MG tablet TAKE 1 TABLET BY MOUTH EVERYDAY AT BEDTIME    . mirabegron ER (MYRBETRIQ) 25 MG TB24 tablet Take by mouth.    . Misc Natural Products (GLUCOSAMINE CHOND COMPLEX/MSM PO) Take by mouth.    Marland Kitchen LEVEMIR FLEXTOUCH 100 UNIT/ML Pen Inject 25 Units into the skin at bedtime.      No current facility-administered medications for this visit.     Patient Active Problem List   Diagnosis Date Noted  . Bilateral primary osteoarthritis of knee 11/23/2018  . Hyperkalemia 01/01/2015  . Essential hypertension 01/01/2015  . Primary osteoarthritis of knee 12/31/2014  . Primary osteoarthritis of right knee 12/11/2014  . Preoperative clearance 11/29/2014  . RBBB 11/29/2014  . Knee pain 11/29/2014  . Heart murmur 11/29/2014   Past Medical History:  Diagnosis Date  . Anxiety   . Arthritis   . Arthritis of knee   . Benign enlargement of prostate    reports that he was born with a large prostate  . Benign paroxysmal positional vertigo due to bilateral vestibular disorder   . Degenerative cervical disc   . Depression   . Diabetes mellitus without complication ([place])   . Elevated prostate specific antigen (PSA)   . Failure of erection   . Hyperkalemia   . Hyperlipidemia   . Hypertension   . Lumbar disc narrowing   . Osteoarthrosis   . Seizures ([place])    as a child age 63  . Type 2 diabetes mellitus ([place])   . Vitamin B12 deficiency   . Vitamin D deficiency     Family History  Problem Relation Age of Onset  . Stroke Father     Past Surgical History:  Procedure Laterality  Date  . KNEE CARTILAGE SURGERY Bilateral   . TOTAL KNEE ARTHROPLASTY Right 12/31/2014   Procedure: TOTAL KNEE ARTHROPLASTY;  Surgeon: Garald Balding, MD;  Location: [facility];  Service: Orthopedics;  Laterality: Right;   Social History   Occupational History  . Not on file  Tobacco Use  . Smoking status: Former Smoker    Types: Pipe    Last attempt to quit: 09/13/1988    Years since quitting: 30.4  .  Smokeless tobacco: Current User    Types: Snuff  Substance and Sexual Activity  . Alcohol use: No  . Drug use: No  . Sexual activity: Not on file

## 2019-02-02 DIAGNOSIS — M1712 Unilateral primary osteoarthritis, left knee: Secondary | ICD-10-CM | POA: Diagnosis not present

## 2019-02-02 DIAGNOSIS — E119 Type 2 diabetes mellitus without complications: Secondary | ICD-10-CM | POA: Diagnosis not present

## 2019-02-02 DIAGNOSIS — N400 Enlarged prostate without lower urinary tract symptoms: Secondary | ICD-10-CM | POA: Diagnosis not present

## 2019-02-02 DIAGNOSIS — T8482XD Fibrosis due to internal orthopedic prosthetic devices, implants and grafts, subsequent encounter: Secondary | ICD-10-CM | POA: Diagnosis not present

## 2019-02-02 DIAGNOSIS — I70238 Atherosclerosis of native arteries of right leg with ulceration of other part of lower right leg: Secondary | ICD-10-CM | POA: Diagnosis not present

## 2019-02-02 DIAGNOSIS — I10 Essential (primary) hypertension: Secondary | ICD-10-CM | POA: Diagnosis not present

## 2019-02-02 DIAGNOSIS — F329 Major depressive disorder, single episode, unspecified: Secondary | ICD-10-CM | POA: Diagnosis not present

## 2019-02-02 DIAGNOSIS — F419 Anxiety disorder, unspecified: Secondary | ICD-10-CM | POA: Diagnosis not present

## 2019-02-02 DIAGNOSIS — M1711 Unilateral primary osteoarthritis, right knee: Secondary | ICD-10-CM | POA: Diagnosis not present

## 2019-02-06 ENCOUNTER — Telehealth: Payer: Self-pay | Admitting: Orthopaedic Surgery

## 2019-02-06 NOTE — Telephone Encounter (Signed)
ok 

## 2019-02-06 NOTE — Telephone Encounter (Signed)
Richard Randolph from [facility] called requesting PT 1 time/ week for 1 week and 2 times/week for 4 weeks.  Please call with verbal authorization at [phone]

## 2019-02-06 NOTE — Telephone Encounter (Signed)
Please advise 

## 2019-02-06 NOTE — Telephone Encounter (Signed)
Spoke with Anda Kraft and told her that Dr.Whitfield agrees with plan of care.

## 2019-02-08 DIAGNOSIS — E119 Type 2 diabetes mellitus without complications: Secondary | ICD-10-CM | POA: Diagnosis not present

## 2019-02-08 DIAGNOSIS — M1711 Unilateral primary osteoarthritis, right knee: Secondary | ICD-10-CM | POA: Diagnosis not present

## 2019-02-08 DIAGNOSIS — T8482XD Fibrosis due to internal orthopedic prosthetic devices, implants and grafts, subsequent encounter: Secondary | ICD-10-CM | POA: Diagnosis not present

## 2019-02-08 DIAGNOSIS — I10 Essential (primary) hypertension: Secondary | ICD-10-CM | POA: Diagnosis not present

## 2019-02-08 DIAGNOSIS — N400 Enlarged prostate without lower urinary tract symptoms: Secondary | ICD-10-CM | POA: Diagnosis not present

## 2019-02-08 DIAGNOSIS — I70238 Atherosclerosis of native arteries of right leg with ulceration of other part of lower right leg: Secondary | ICD-10-CM | POA: Diagnosis not present

## 2019-02-08 DIAGNOSIS — F329 Major depressive disorder, single episode, unspecified: Secondary | ICD-10-CM | POA: Diagnosis not present

## 2019-02-08 DIAGNOSIS — M1712 Unilateral primary osteoarthritis, left knee: Secondary | ICD-10-CM | POA: Diagnosis not present

## 2019-02-08 DIAGNOSIS — F419 Anxiety disorder, unspecified: Secondary | ICD-10-CM | POA: Diagnosis not present

## 2019-02-12 DIAGNOSIS — F419 Anxiety disorder, unspecified: Secondary | ICD-10-CM | POA: Diagnosis not present

## 2019-02-12 DIAGNOSIS — F329 Major depressive disorder, single episode, unspecified: Secondary | ICD-10-CM | POA: Diagnosis not present

## 2019-02-12 DIAGNOSIS — I10 Essential (primary) hypertension: Secondary | ICD-10-CM | POA: Diagnosis not present

## 2019-02-12 DIAGNOSIS — M1711 Unilateral primary osteoarthritis, right knee: Secondary | ICD-10-CM | POA: Diagnosis not present

## 2019-02-12 DIAGNOSIS — M1712 Unilateral primary osteoarthritis, left knee: Secondary | ICD-10-CM | POA: Diagnosis not present

## 2019-02-12 DIAGNOSIS — I70238 Atherosclerosis of native arteries of right leg with ulceration of other part of lower right leg: Secondary | ICD-10-CM | POA: Diagnosis not present

## 2019-02-12 DIAGNOSIS — T8482XD Fibrosis due to internal orthopedic prosthetic devices, implants and grafts, subsequent encounter: Secondary | ICD-10-CM | POA: Diagnosis not present

## 2019-02-12 DIAGNOSIS — E119 Type 2 diabetes mellitus without complications: Secondary | ICD-10-CM | POA: Diagnosis not present

## 2019-02-12 DIAGNOSIS — N400 Enlarged prostate without lower urinary tract symptoms: Secondary | ICD-10-CM | POA: Diagnosis not present

## 2019-02-14 DIAGNOSIS — F329 Major depressive disorder, single episode, unspecified: Secondary | ICD-10-CM | POA: Diagnosis not present

## 2019-02-14 DIAGNOSIS — M1712 Unilateral primary osteoarthritis, left knee: Secondary | ICD-10-CM | POA: Diagnosis not present

## 2019-02-14 DIAGNOSIS — M1711 Unilateral primary osteoarthritis, right knee: Secondary | ICD-10-CM | POA: Diagnosis not present

## 2019-02-14 DIAGNOSIS — I70238 Atherosclerosis of native arteries of right leg with ulceration of other part of lower right leg: Secondary | ICD-10-CM | POA: Diagnosis not present

## 2019-02-14 DIAGNOSIS — T8482XD Fibrosis due to internal orthopedic prosthetic devices, implants and grafts, subsequent encounter: Secondary | ICD-10-CM | POA: Diagnosis not present

## 2019-02-14 DIAGNOSIS — N400 Enlarged prostate without lower urinary tract symptoms: Secondary | ICD-10-CM | POA: Diagnosis not present

## 2019-02-14 DIAGNOSIS — F419 Anxiety disorder, unspecified: Secondary | ICD-10-CM | POA: Diagnosis not present

## 2019-02-14 DIAGNOSIS — E119 Type 2 diabetes mellitus without complications: Secondary | ICD-10-CM | POA: Diagnosis not present

## 2019-02-14 DIAGNOSIS — I10 Essential (primary) hypertension: Secondary | ICD-10-CM | POA: Diagnosis not present

## 2019-02-15 ENCOUNTER — Encounter: Payer: Self-pay | Admitting: Orthopaedic Surgery

## 2019-02-15 ENCOUNTER — Other Ambulatory Visit: Payer: Self-pay

## 2019-02-15 ENCOUNTER — Telehealth: Payer: Self-pay | Admitting: *Deleted

## 2019-02-15 ENCOUNTER — Ambulatory Visit (INDEPENDENT_AMBULATORY_CARE_PROVIDER_SITE_OTHER): Payer: Medicare HMO | Admitting: Orthopaedic Surgery

## 2019-02-15 VITALS — BP 163/69 | HR 66 | Ht 68.0 in | Wt 170.0 lb

## 2019-02-15 DIAGNOSIS — M1712 Unilateral primary osteoarthritis, left knee: Secondary | ICD-10-CM

## 2019-02-15 MED ORDER — BUPIVACAINE HCL 0.5 % IJ SOLN
2.0000 mL | INTRAMUSCULAR | Status: AC | PRN
  Administered 2019-02-15: 2 mL via INTRA_ARTICULAR

## 2019-02-15 MED ORDER — LIDOCAINE HCL 1 % IJ SOLN
2.0000 mL | INTRAMUSCULAR | Status: AC | PRN
  Administered 2019-02-15: 2 mL

## 2019-02-15 MED ORDER — METHYLPREDNISOLONE ACETATE 40 MG/ML IJ SUSP
80.0000 mg | INTRAMUSCULAR | Status: AC | PRN
  Administered 2019-02-15: 80 mg via INTRA_ARTICULAR

## 2019-02-15 NOTE — Progress Notes (Signed)
Office Visit Note   Patient: Richard Randolph           Date of Birth: 18-Jun-1931           MRN: [id number] Visit Date: 02/15/2019              Requested by: Nicoletta Dress, MD [address] [place], [place] [address] PCP: Nicoletta Dress, MD   Assessment & Plan: Visit Diagnoses:  1. Primary osteoarthritis of left knee     Plan: Discussion with Mr. Camargo and his wife.  He has end-stage osteoarthritis of his left knee with loss of motion with bone-on-bone in the medial component definitive procedure would be a total knee replacement but I am not sure he is a good candidate based on his I will inject the knee today and then precertified Visco supplementation This patient is diagnosed with osteoarthritis of the knee(s).    Radiographs show evidence of joint space narrowing, osteophytes, subchondral sclerosis and/or subchondral cysts.  This patient has knee pain which interferes with functional and activities of daily living.    This patient has experienced inadequate response, adverse effects and/or intolerance with conservative treatments such as acetaminophen, NSAIDS, topical creams, physical therapy or regular exercise, knee bracing and/or weight loss.   This patient has experienced inadequate response or has a contraindication to intra articular steroid injections for at least 3 months.   This patient is not scheduled to have a total knee replacement within 6 months of starting treatment with viscosupplementation.   Follow-Up Instructions: No follow-ups on file.   Orders:  Orders Placed This Encounter  Procedures  . Large Joint Inj: L knee   No orders of the defined types were placed in this encounter.     Procedures: Large Joint Inj: L knee on 02/15/2019 2:14 PM Indications: pain and diagnostic evaluation Details: 25 G 1.5 in needle, anteromedial approach  Arthrogram: No  Medications: 2 mL lidocaine 1 %; 2 mL bupivacaine 0.5 %; 80 mg methylPREDNISolone  acetate 40 MG/ML Procedure, treatment alternatives, risks and benefits explained, specific risks discussed. Consent was given by the patient. Patient was prepped and draped in the usual sterile fashion.       Clinical Data: No additional findings.   Subjective: Chief Complaint  Patient presents with  . Left Knee - Follow-up  Patient presents today for a two week follow up on his left knee. He said that he fell again down the stairs after his last visit. Patient states that his knee hurts. He has been doing home health physical therapy twice weekly.  Has been using a walker and a pullover knee support  HPI  Review of Systems   Objective: Vital Signs: BP (!) 163/69   Pulse 66   Ht 5\' 8"  (1.727 m)   Wt 170 lb (77.1 kg)   BMI 25.85 kg/m   Physical Exam Constitutional:      Appearance: He is well-developed.  Eyes:     Pupils: Pupils are equal, round, and reactive to light.  Pulmonary:     Effort: Pulmonary effort is normal.  Skin:    General: Skin is warm and dry.  Neurological:     Mental Status: He is alert and oriented to person, place, and time.  Psychiatric:        Behavior: Behavior normal.     Ortho Exam left knee lacks about 30 degrees of full extension and flexed about 95 degrees.  No effusion.  Predominately  medial joint pain with significant varus.  No distal edema.  Motor exam intact.  Painless range of motion left hip.  Also has a right total knee replacement which is not uncomfortable but does have decreased range of motion  Specialty Comments:  No specialty comments available.  Imaging: No results found.   PMFS History: Patient Active Problem List   Diagnosis Date Noted  . Bilateral primary osteoarthritis of knee 11/23/2018  . Hyperkalemia 01/01/2015  . Essential hypertension 01/01/2015  . Primary osteoarthritis of knee 12/31/2014  . Primary osteoarthritis of right knee 12/11/2014  . Preoperative clearance 11/29/2014  . RBBB 11/29/2014  .  Knee pain 11/29/2014  . Heart murmur 11/29/2014   Past Medical History:  Diagnosis Date  . Anxiety   . Arthritis   . Arthritis of knee   . Benign enlargement of prostate    reports that he was born with a large prostate  . Benign paroxysmal positional vertigo due to bilateral vestibular disorder   . Degenerative cervical disc   . Depression   . Diabetes mellitus without complication ([place])   . Elevated prostate specific antigen (PSA)   . Failure of erection   . Hyperkalemia   . Hyperlipidemia   . Hypertension   . Lumbar disc narrowing   . Osteoarthrosis   . Seizures ([place])    as a child age 55  . Type 2 diabetes mellitus ([place])   . Vitamin B12 deficiency   . Vitamin D deficiency     Family History  Problem Relation Age of Onset  . Stroke Father     Past Surgical History:  Procedure Laterality Date  . KNEE CARTILAGE SURGERY Bilateral   . TOTAL KNEE ARTHROPLASTY Right 12/31/2014   Procedure: TOTAL KNEE ARTHROPLASTY;  Surgeon: Garald Balding, MD;  Location: [facility];  Service: Orthopedics;  Laterality: Right;   Social History   Occupational History  . Not on file  Tobacco Use  . Smoking status: Former Smoker    Types: Pipe    Last attempt to quit: 09/13/1988    Years since quitting: 30.4  . Smokeless tobacco: Current User    Types: Snuff  Substance and Sexual Activity  . Alcohol use: No  . Drug use: No  . Sexual activity: Not on file

## 2019-02-15 NOTE — Telephone Encounter (Signed)
Noted  

## 2019-02-15 NOTE — Telephone Encounter (Signed)
Please apply for visco for Dr. Durward Fortes patient Left knee. Thank you.

## 2019-02-16 ENCOUNTER — Telehealth: Payer: Self-pay

## 2019-02-16 NOTE — Telephone Encounter (Signed)
Submitted VOB for Orthovisc series, left knee.

## 2019-02-20 ENCOUNTER — Telehealth: Payer: Self-pay

## 2019-02-20 DIAGNOSIS — M1712 Unilateral primary osteoarthritis, left knee: Secondary | ICD-10-CM | POA: Diagnosis not present

## 2019-02-20 DIAGNOSIS — M1711 Unilateral primary osteoarthritis, right knee: Secondary | ICD-10-CM | POA: Diagnosis not present

## 2019-02-20 DIAGNOSIS — I70238 Atherosclerosis of native arteries of right leg with ulceration of other part of lower right leg: Secondary | ICD-10-CM | POA: Diagnosis not present

## 2019-02-20 DIAGNOSIS — I10 Essential (primary) hypertension: Secondary | ICD-10-CM | POA: Diagnosis not present

## 2019-02-20 DIAGNOSIS — N400 Enlarged prostate without lower urinary tract symptoms: Secondary | ICD-10-CM | POA: Diagnosis not present

## 2019-02-20 DIAGNOSIS — F329 Major depressive disorder, single episode, unspecified: Secondary | ICD-10-CM | POA: Diagnosis not present

## 2019-02-20 DIAGNOSIS — E119 Type 2 diabetes mellitus without complications: Secondary | ICD-10-CM | POA: Diagnosis not present

## 2019-02-20 DIAGNOSIS — T8482XD Fibrosis due to internal orthopedic prosthetic devices, implants and grafts, subsequent encounter: Secondary | ICD-10-CM | POA: Diagnosis not present

## 2019-02-20 DIAGNOSIS — F419 Anxiety disorder, unspecified: Secondary | ICD-10-CM | POA: Diagnosis not present

## 2019-02-20 NOTE — Telephone Encounter (Signed)
Please schedule patient an appointment with Dr. Durward Fortes for gel injection.  Thank You.  Approved for Orthovisc series, left knee. [place] Patient will be responsible for 20% of the allowable amount. Co-pay of $45.00 each visit No PA required

## 2019-02-21 DIAGNOSIS — J1289 Other viral pneumonia: Secondary | ICD-10-CM | POA: Diagnosis not present

## 2019-02-21 DIAGNOSIS — G9341 Metabolic encephalopathy: Secondary | ICD-10-CM | POA: Diagnosis not present

## 2019-02-21 DIAGNOSIS — Z881 Allergy status to other antibiotic agents status: Secondary | ICD-10-CM | POA: Diagnosis not present

## 2019-02-21 DIAGNOSIS — R509 Fever, unspecified: Secondary | ICD-10-CM | POA: Diagnosis not present

## 2019-02-21 DIAGNOSIS — I10 Essential (primary) hypertension: Secondary | ICD-10-CM | POA: Diagnosis not present

## 2019-02-21 DIAGNOSIS — R748 Abnormal levels of other serum enzymes: Secondary | ICD-10-CM | POA: Diagnosis not present

## 2019-02-21 DIAGNOSIS — E785 Hyperlipidemia, unspecified: Secondary | ICD-10-CM | POA: Diagnosis not present

## 2019-02-21 DIAGNOSIS — E1140 Type 2 diabetes mellitus with diabetic neuropathy, unspecified: Secondary | ICD-10-CM | POA: Diagnosis not present

## 2019-02-21 DIAGNOSIS — F17290 Nicotine dependence, other tobacco product, uncomplicated: Secondary | ICD-10-CM | POA: Diagnosis not present

## 2019-02-21 DIAGNOSIS — H6122 Impacted cerumen, left ear: Secondary | ICD-10-CM | POA: Diagnosis not present

## 2019-02-21 DIAGNOSIS — E1165 Type 2 diabetes mellitus with hyperglycemia: Secondary | ICD-10-CM | POA: Diagnosis not present

## 2019-02-21 DIAGNOSIS — R918 Other nonspecific abnormal finding of lung field: Secondary | ICD-10-CM | POA: Diagnosis not present

## 2019-02-21 DIAGNOSIS — F0390 Unspecified dementia without behavioral disturbance: Secondary | ICD-10-CM | POA: Diagnosis not present

## 2019-02-21 DIAGNOSIS — D519 Vitamin B12 deficiency anemia, unspecified: Secondary | ICD-10-CM | POA: Diagnosis not present

## 2019-02-21 DIAGNOSIS — R4182 Altered mental status, unspecified: Secondary | ICD-10-CM | POA: Diagnosis not present

## 2019-02-21 DIAGNOSIS — F329 Major depressive disorder, single episode, unspecified: Secondary | ICD-10-CM | POA: Diagnosis not present

## 2019-02-21 DIAGNOSIS — R930 Abnormal findings on diagnostic imaging of skull and head, not elsewhere classified: Secondary | ICD-10-CM | POA: Diagnosis not present

## 2019-02-21 DIAGNOSIS — U071 COVID-19: Secondary | ICD-10-CM | POA: Diagnosis not present

## 2019-02-21 DIAGNOSIS — E119 Type 2 diabetes mellitus without complications: Secondary | ICD-10-CM | POA: Diagnosis not present

## 2019-02-21 DIAGNOSIS — R32 Unspecified urinary incontinence: Secondary | ICD-10-CM | POA: Diagnosis not present

## 2019-02-22 DIAGNOSIS — R410 Disorientation, unspecified: Secondary | ICD-10-CM | POA: Diagnosis not present

## 2019-02-22 DIAGNOSIS — F0390 Unspecified dementia without behavioral disturbance: Secondary | ICD-10-CM | POA: Diagnosis not present

## 2019-02-22 DIAGNOSIS — J129 Viral pneumonia, unspecified: Secondary | ICD-10-CM | POA: Diagnosis not present

## 2019-02-22 DIAGNOSIS — F17290 Nicotine dependence, other tobacco product, uncomplicated: Secondary | ICD-10-CM | POA: Diagnosis not present

## 2019-02-22 DIAGNOSIS — J189 Pneumonia, unspecified organism: Secondary | ICD-10-CM | POA: Diagnosis not present

## 2019-02-22 DIAGNOSIS — R32 Unspecified urinary incontinence: Secondary | ICD-10-CM | POA: Diagnosis not present

## 2019-02-22 DIAGNOSIS — R4182 Altered mental status, unspecified: Secondary | ICD-10-CM | POA: Diagnosis not present

## 2019-02-22 DIAGNOSIS — R509 Fever, unspecified: Secondary | ICD-10-CM | POA: Diagnosis not present

## 2019-02-22 DIAGNOSIS — J1289 Other viral pneumonia: Secondary | ICD-10-CM | POA: Diagnosis not present

## 2019-02-22 DIAGNOSIS — I10 Essential (primary) hypertension: Secondary | ICD-10-CM | POA: Diagnosis not present

## 2019-02-22 DIAGNOSIS — N3000 Acute cystitis without hematuria: Secondary | ICD-10-CM | POA: Diagnosis not present

## 2019-02-22 DIAGNOSIS — J9691 Respiratory failure, unspecified with hypoxia: Secondary | ICD-10-CM | POA: Diagnosis not present

## 2019-02-22 DIAGNOSIS — E1140 Type 2 diabetes mellitus with diabetic neuropathy, unspecified: Secondary | ICD-10-CM | POA: Diagnosis not present

## 2019-02-22 DIAGNOSIS — E871 Hypo-osmolality and hyponatremia: Secondary | ICD-10-CM | POA: Diagnosis not present

## 2019-02-22 DIAGNOSIS — R279 Unspecified lack of coordination: Secondary | ICD-10-CM | POA: Diagnosis not present

## 2019-02-22 DIAGNOSIS — U071 COVID-19: Secondary | ICD-10-CM | POA: Diagnosis not present

## 2019-02-22 DIAGNOSIS — E119 Type 2 diabetes mellitus without complications: Secondary | ICD-10-CM | POA: Diagnosis not present

## 2019-02-22 DIAGNOSIS — I350 Nonrheumatic aortic (valve) stenosis: Secondary | ICD-10-CM | POA: Diagnosis not present

## 2019-02-22 DIAGNOSIS — J22 Unspecified acute lower respiratory infection: Secondary | ICD-10-CM | POA: Diagnosis not present

## 2019-02-22 DIAGNOSIS — R0902 Hypoxemia: Secondary | ICD-10-CM | POA: Diagnosis not present

## 2019-02-22 DIAGNOSIS — R918 Other nonspecific abnormal finding of lung field: Secondary | ICD-10-CM | POA: Diagnosis not present

## 2019-02-22 DIAGNOSIS — R262 Difficulty in walking, not elsewhere classified: Secondary | ICD-10-CM | POA: Diagnosis not present

## 2019-02-22 DIAGNOSIS — R109 Unspecified abdominal pain: Secondary | ICD-10-CM | POA: Diagnosis not present

## 2019-02-22 DIAGNOSIS — E876 Hypokalemia: Secondary | ICD-10-CM | POA: Diagnosis not present

## 2019-02-22 DIAGNOSIS — R05 Cough: Secondary | ICD-10-CM | POA: Diagnosis not present

## 2019-02-22 DIAGNOSIS — Z743 Need for continuous supervision: Secondary | ICD-10-CM | POA: Diagnosis not present

## 2019-02-22 DIAGNOSIS — G3184 Mild cognitive impairment, so stated: Secondary | ICD-10-CM | POA: Diagnosis not present

## 2019-02-22 DIAGNOSIS — R7989 Other specified abnormal findings of blood chemistry: Secondary | ICD-10-CM | POA: Diagnosis not present

## 2019-02-22 DIAGNOSIS — M6281 Muscle weakness (generalized): Secondary | ICD-10-CM | POA: Diagnosis not present

## 2019-02-22 DIAGNOSIS — J1281 Pneumonia due to SARS-associated coronavirus: Secondary | ICD-10-CM | POA: Diagnosis not present

## 2019-02-22 DIAGNOSIS — Z881 Allergy status to other antibiotic agents status: Secondary | ICD-10-CM | POA: Diagnosis not present

## 2019-02-22 DIAGNOSIS — G9341 Metabolic encephalopathy: Secondary | ICD-10-CM | POA: Diagnosis not present

## 2019-02-22 DIAGNOSIS — R930 Abnormal findings on diagnostic imaging of skull and head, not elsewhere classified: Secondary | ICD-10-CM | POA: Diagnosis not present

## 2019-02-26 NOTE — Telephone Encounter (Signed)
LMOM for patient to call and schedule Orthovisc injections for his left knee.

## 2019-03-01 NOTE — Telephone Encounter (Signed)
LMOM for patient to call and schedule injections.

## 2019-03-14 DIAGNOSIS — J22 Unspecified acute lower respiratory infection: Secondary | ICD-10-CM | POA: Diagnosis not present

## 2019-03-14 DIAGNOSIS — N400 Enlarged prostate without lower urinary tract symptoms: Secondary | ICD-10-CM | POA: Diagnosis not present

## 2019-03-14 DIAGNOSIS — G3184 Mild cognitive impairment, so stated: Secondary | ICD-10-CM | POA: Diagnosis not present

## 2019-03-14 DIAGNOSIS — I10 Essential (primary) hypertension: Secondary | ICD-10-CM | POA: Diagnosis not present

## 2019-03-14 DIAGNOSIS — R410 Disorientation, unspecified: Secondary | ICD-10-CM | POA: Diagnosis not present

## 2019-03-14 DIAGNOSIS — Z20828 Contact with and (suspected) exposure to other viral communicable diseases: Secondary | ICD-10-CM | POA: Diagnosis not present

## 2019-03-14 DIAGNOSIS — R0902 Hypoxemia: Secondary | ICD-10-CM | POA: Diagnosis not present

## 2019-03-14 DIAGNOSIS — M6281 Muscle weakness (generalized): Secondary | ICD-10-CM | POA: Diagnosis not present

## 2019-03-14 DIAGNOSIS — J129 Viral pneumonia, unspecified: Secondary | ICD-10-CM | POA: Diagnosis not present

## 2019-03-14 DIAGNOSIS — B9729 Other coronavirus as the cause of diseases classified elsewhere: Secondary | ICD-10-CM | POA: Diagnosis not present

## 2019-03-14 DIAGNOSIS — R630 Anorexia: Secondary | ICD-10-CM | POA: Diagnosis not present

## 2019-03-14 DIAGNOSIS — E119 Type 2 diabetes mellitus without complications: Secondary | ICD-10-CM | POA: Diagnosis not present

## 2019-03-14 DIAGNOSIS — U071 COVID-19: Secondary | ICD-10-CM | POA: Diagnosis not present

## 2019-03-14 DIAGNOSIS — G9341 Metabolic encephalopathy: Secondary | ICD-10-CM | POA: Diagnosis not present

## 2019-03-14 DIAGNOSIS — J1281 Pneumonia due to SARS-associated coronavirus: Secondary | ICD-10-CM | POA: Diagnosis not present

## 2019-03-14 DIAGNOSIS — Z743 Need for continuous supervision: Secondary | ICD-10-CM | POA: Diagnosis not present

## 2019-03-14 DIAGNOSIS — R279 Unspecified lack of coordination: Secondary | ICD-10-CM | POA: Diagnosis not present

## 2019-03-14 DIAGNOSIS — R262 Difficulty in walking, not elsewhere classified: Secondary | ICD-10-CM | POA: Diagnosis not present

## 2019-03-21 DIAGNOSIS — J129 Viral pneumonia, unspecified: Secondary | ICD-10-CM | POA: Diagnosis not present

## 2019-03-21 DIAGNOSIS — E119 Type 2 diabetes mellitus without complications: Secondary | ICD-10-CM | POA: Diagnosis not present

## 2019-03-21 DIAGNOSIS — G9341 Metabolic encephalopathy: Secondary | ICD-10-CM | POA: Diagnosis not present

## 2019-03-21 DIAGNOSIS — I10 Essential (primary) hypertension: Secondary | ICD-10-CM | POA: Diagnosis not present

## 2019-03-22 DIAGNOSIS — E119 Type 2 diabetes mellitus without complications: Secondary | ICD-10-CM | POA: Diagnosis not present

## 2019-03-22 DIAGNOSIS — N400 Enlarged prostate without lower urinary tract symptoms: Secondary | ICD-10-CM | POA: Diagnosis not present

## 2019-03-22 DIAGNOSIS — R630 Anorexia: Secondary | ICD-10-CM | POA: Diagnosis not present

## 2019-03-22 DIAGNOSIS — I10 Essential (primary) hypertension: Secondary | ICD-10-CM | POA: Diagnosis not present

## 2019-04-02 DIAGNOSIS — U071 COVID-19: Secondary | ICD-10-CM | POA: Diagnosis not present

## 2019-04-06 DIAGNOSIS — B9729 Other coronavirus as the cause of diseases classified elsewhere: Secondary | ICD-10-CM | POA: Diagnosis not present

## 2019-04-16 DIAGNOSIS — Z20828 Contact with and (suspected) exposure to other viral communicable diseases: Secondary | ICD-10-CM | POA: Diagnosis not present

## 2019-04-25 DIAGNOSIS — U071 COVID-19: Secondary | ICD-10-CM | POA: Diagnosis not present

## 2019-04-27 DIAGNOSIS — G3184 Mild cognitive impairment, so stated: Secondary | ICD-10-CM | POA: Diagnosis not present

## 2019-04-27 DIAGNOSIS — E119 Type 2 diabetes mellitus without complications: Secondary | ICD-10-CM | POA: Diagnosis not present

## 2019-04-27 DIAGNOSIS — R269 Unspecified abnormalities of gait and mobility: Secondary | ICD-10-CM | POA: Diagnosis not present

## 2019-04-27 DIAGNOSIS — N400 Enlarged prostate without lower urinary tract symptoms: Secondary | ICD-10-CM | POA: Diagnosis not present

## 2019-04-30 DIAGNOSIS — J1281 Pneumonia due to SARS-associated coronavirus: Secondary | ICD-10-CM | POA: Diagnosis not present

## 2019-04-30 DIAGNOSIS — U071 COVID-19: Secondary | ICD-10-CM | POA: Diagnosis not present

## 2019-04-30 DIAGNOSIS — M6281 Muscle weakness (generalized): Secondary | ICD-10-CM | POA: Diagnosis not present

## 2019-04-30 DIAGNOSIS — E119 Type 2 diabetes mellitus without complications: Secondary | ICD-10-CM | POA: Diagnosis not present

## 2019-04-30 DIAGNOSIS — R1312 Dysphagia, oropharyngeal phase: Secondary | ICD-10-CM | POA: Diagnosis not present

## 2019-04-30 DIAGNOSIS — I10 Essential (primary) hypertension: Secondary | ICD-10-CM | POA: Diagnosis not present

## 2019-04-30 DIAGNOSIS — G3184 Mild cognitive impairment, so stated: Secondary | ICD-10-CM | POA: Diagnosis not present

## 2019-04-30 DIAGNOSIS — G9341 Metabolic encephalopathy: Secondary | ICD-10-CM | POA: Diagnosis not present

## 2019-04-30 DIAGNOSIS — R262 Difficulty in walking, not elsewhere classified: Secondary | ICD-10-CM | POA: Diagnosis not present

## 2019-05-01 DIAGNOSIS — U071 COVID-19: Secondary | ICD-10-CM | POA: Diagnosis not present

## 2019-05-01 DIAGNOSIS — R262 Difficulty in walking, not elsewhere classified: Secondary | ICD-10-CM | POA: Diagnosis not present

## 2019-05-01 DIAGNOSIS — G9341 Metabolic encephalopathy: Secondary | ICD-10-CM | POA: Diagnosis not present

## 2019-05-01 DIAGNOSIS — I10 Essential (primary) hypertension: Secondary | ICD-10-CM | POA: Diagnosis not present

## 2019-05-01 DIAGNOSIS — M6281 Muscle weakness (generalized): Secondary | ICD-10-CM | POA: Diagnosis not present

## 2019-05-01 DIAGNOSIS — G3184 Mild cognitive impairment, so stated: Secondary | ICD-10-CM | POA: Diagnosis not present

## 2019-05-01 DIAGNOSIS — E119 Type 2 diabetes mellitus without complications: Secondary | ICD-10-CM | POA: Diagnosis not present

## 2019-05-01 DIAGNOSIS — J1281 Pneumonia due to SARS-associated coronavirus: Secondary | ICD-10-CM | POA: Diagnosis not present

## 2019-05-01 DIAGNOSIS — R1312 Dysphagia, oropharyngeal phase: Secondary | ICD-10-CM | POA: Diagnosis not present

## 2019-05-02 DIAGNOSIS — R262 Difficulty in walking, not elsewhere classified: Secondary | ICD-10-CM | POA: Diagnosis not present

## 2019-05-02 DIAGNOSIS — U071 COVID-19: Secondary | ICD-10-CM | POA: Diagnosis not present

## 2019-05-02 DIAGNOSIS — J1281 Pneumonia due to SARS-associated coronavirus: Secondary | ICD-10-CM | POA: Diagnosis not present

## 2019-05-02 DIAGNOSIS — R1312 Dysphagia, oropharyngeal phase: Secondary | ICD-10-CM | POA: Diagnosis not present

## 2019-05-02 DIAGNOSIS — E119 Type 2 diabetes mellitus without complications: Secondary | ICD-10-CM | POA: Diagnosis not present

## 2019-05-02 DIAGNOSIS — G3184 Mild cognitive impairment, so stated: Secondary | ICD-10-CM | POA: Diagnosis not present

## 2019-05-02 DIAGNOSIS — M6281 Muscle weakness (generalized): Secondary | ICD-10-CM | POA: Diagnosis not present

## 2019-05-02 DIAGNOSIS — I10 Essential (primary) hypertension: Secondary | ICD-10-CM | POA: Diagnosis not present

## 2019-05-02 DIAGNOSIS — G9341 Metabolic encephalopathy: Secondary | ICD-10-CM | POA: Diagnosis not present

## 2019-05-03 DIAGNOSIS — R262 Difficulty in walking, not elsewhere classified: Secondary | ICD-10-CM | POA: Diagnosis not present

## 2019-05-03 DIAGNOSIS — M6281 Muscle weakness (generalized): Secondary | ICD-10-CM | POA: Diagnosis not present

## 2019-05-03 DIAGNOSIS — I10 Essential (primary) hypertension: Secondary | ICD-10-CM | POA: Diagnosis not present

## 2019-05-03 DIAGNOSIS — E119 Type 2 diabetes mellitus without complications: Secondary | ICD-10-CM | POA: Diagnosis not present

## 2019-05-03 DIAGNOSIS — G3184 Mild cognitive impairment, so stated: Secondary | ICD-10-CM | POA: Diagnosis not present

## 2019-05-03 DIAGNOSIS — J1281 Pneumonia due to SARS-associated coronavirus: Secondary | ICD-10-CM | POA: Diagnosis not present

## 2019-05-03 DIAGNOSIS — R1312 Dysphagia, oropharyngeal phase: Secondary | ICD-10-CM | POA: Diagnosis not present

## 2019-05-03 DIAGNOSIS — U071 COVID-19: Secondary | ICD-10-CM | POA: Diagnosis not present

## 2019-05-03 DIAGNOSIS — G9341 Metabolic encephalopathy: Secondary | ICD-10-CM | POA: Diagnosis not present

## 2019-05-04 DIAGNOSIS — E119 Type 2 diabetes mellitus without complications: Secondary | ICD-10-CM | POA: Diagnosis not present

## 2019-05-04 DIAGNOSIS — R262 Difficulty in walking, not elsewhere classified: Secondary | ICD-10-CM | POA: Diagnosis not present

## 2019-05-04 DIAGNOSIS — U071 COVID-19: Secondary | ICD-10-CM | POA: Diagnosis not present

## 2019-05-04 DIAGNOSIS — G3184 Mild cognitive impairment, so stated: Secondary | ICD-10-CM | POA: Diagnosis not present

## 2019-05-04 DIAGNOSIS — J1281 Pneumonia due to SARS-associated coronavirus: Secondary | ICD-10-CM | POA: Diagnosis not present

## 2019-05-04 DIAGNOSIS — G9341 Metabolic encephalopathy: Secondary | ICD-10-CM | POA: Diagnosis not present

## 2019-05-04 DIAGNOSIS — M6281 Muscle weakness (generalized): Secondary | ICD-10-CM | POA: Diagnosis not present

## 2019-05-04 DIAGNOSIS — R1312 Dysphagia, oropharyngeal phase: Secondary | ICD-10-CM | POA: Diagnosis not present

## 2019-05-04 DIAGNOSIS — I10 Essential (primary) hypertension: Secondary | ICD-10-CM | POA: Diagnosis not present

## 2019-05-08 DIAGNOSIS — G3184 Mild cognitive impairment, so stated: Secondary | ICD-10-CM | POA: Diagnosis not present

## 2019-05-08 DIAGNOSIS — R262 Difficulty in walking, not elsewhere classified: Secondary | ICD-10-CM | POA: Diagnosis not present

## 2019-05-08 DIAGNOSIS — M6281 Muscle weakness (generalized): Secondary | ICD-10-CM | POA: Diagnosis not present

## 2019-05-08 DIAGNOSIS — R1312 Dysphagia, oropharyngeal phase: Secondary | ICD-10-CM | POA: Diagnosis not present

## 2019-05-08 DIAGNOSIS — G9341 Metabolic encephalopathy: Secondary | ICD-10-CM | POA: Diagnosis not present

## 2019-05-08 DIAGNOSIS — J1281 Pneumonia due to SARS-associated coronavirus: Secondary | ICD-10-CM | POA: Diagnosis not present

## 2019-05-08 DIAGNOSIS — I10 Essential (primary) hypertension: Secondary | ICD-10-CM | POA: Diagnosis not present

## 2019-05-08 DIAGNOSIS — U071 COVID-19: Secondary | ICD-10-CM | POA: Diagnosis not present

## 2019-05-08 DIAGNOSIS — E119 Type 2 diabetes mellitus without complications: Secondary | ICD-10-CM | POA: Diagnosis not present

## 2019-05-09 DIAGNOSIS — G9341 Metabolic encephalopathy: Secondary | ICD-10-CM | POA: Diagnosis not present

## 2019-05-09 DIAGNOSIS — E119 Type 2 diabetes mellitus without complications: Secondary | ICD-10-CM | POA: Diagnosis not present

## 2019-05-09 DIAGNOSIS — M6281 Muscle weakness (generalized): Secondary | ICD-10-CM | POA: Diagnosis not present

## 2019-05-09 DIAGNOSIS — U071 COVID-19: Secondary | ICD-10-CM | POA: Diagnosis not present

## 2019-05-09 DIAGNOSIS — G3184 Mild cognitive impairment, so stated: Secondary | ICD-10-CM | POA: Diagnosis not present

## 2019-05-09 DIAGNOSIS — R262 Difficulty in walking, not elsewhere classified: Secondary | ICD-10-CM | POA: Diagnosis not present

## 2019-05-09 DIAGNOSIS — J1281 Pneumonia due to SARS-associated coronavirus: Secondary | ICD-10-CM | POA: Diagnosis not present

## 2019-05-09 DIAGNOSIS — I10 Essential (primary) hypertension: Secondary | ICD-10-CM | POA: Diagnosis not present

## 2019-05-09 DIAGNOSIS — R1312 Dysphagia, oropharyngeal phase: Secondary | ICD-10-CM | POA: Diagnosis not present

## 2019-05-10 DIAGNOSIS — J1281 Pneumonia due to SARS-associated coronavirus: Secondary | ICD-10-CM | POA: Diagnosis not present

## 2019-05-10 DIAGNOSIS — R262 Difficulty in walking, not elsewhere classified: Secondary | ICD-10-CM | POA: Diagnosis not present

## 2019-05-10 DIAGNOSIS — M6281 Muscle weakness (generalized): Secondary | ICD-10-CM | POA: Diagnosis not present

## 2019-05-10 DIAGNOSIS — E119 Type 2 diabetes mellitus without complications: Secondary | ICD-10-CM | POA: Diagnosis not present

## 2019-05-10 DIAGNOSIS — G3184 Mild cognitive impairment, so stated: Secondary | ICD-10-CM | POA: Diagnosis not present

## 2019-05-10 DIAGNOSIS — R1312 Dysphagia, oropharyngeal phase: Secondary | ICD-10-CM | POA: Diagnosis not present

## 2019-05-10 DIAGNOSIS — U071 COVID-19: Secondary | ICD-10-CM | POA: Diagnosis not present

## 2019-05-10 DIAGNOSIS — G9341 Metabolic encephalopathy: Secondary | ICD-10-CM | POA: Diagnosis not present

## 2019-05-10 DIAGNOSIS — I10 Essential (primary) hypertension: Secondary | ICD-10-CM | POA: Diagnosis not present

## 2019-05-15 DIAGNOSIS — J129 Viral pneumonia, unspecified: Secondary | ICD-10-CM | POA: Diagnosis not present

## 2019-05-15 DIAGNOSIS — E119 Type 2 diabetes mellitus without complications: Secondary | ICD-10-CM | POA: Diagnosis not present

## 2019-05-15 DIAGNOSIS — G9341 Metabolic encephalopathy: Secondary | ICD-10-CM | POA: Diagnosis not present

## 2019-05-15 DIAGNOSIS — U071 COVID-19: Secondary | ICD-10-CM | POA: Diagnosis not present

## 2019-05-20 DIAGNOSIS — R52 Pain, unspecified: Secondary | ICD-10-CM | POA: Diagnosis not present

## 2019-05-20 DIAGNOSIS — S0990XA Unspecified injury of head, initial encounter: Secondary | ICD-10-CM | POA: Diagnosis not present

## 2019-05-20 DIAGNOSIS — R51 Headache: Secondary | ICD-10-CM | POA: Diagnosis not present

## 2019-05-20 DIAGNOSIS — W19XXXA Unspecified fall, initial encounter: Secondary | ICD-10-CM | POA: Diagnosis not present

## 2019-05-20 DIAGNOSIS — S3993XA Unspecified injury of pelvis, initial encounter: Secondary | ICD-10-CM | POA: Diagnosis not present

## 2019-05-20 DIAGNOSIS — S0003XA Contusion of scalp, initial encounter: Secondary | ICD-10-CM | POA: Diagnosis not present

## 2019-05-20 DIAGNOSIS — S199XXA Unspecified injury of neck, initial encounter: Secondary | ICD-10-CM | POA: Diagnosis not present

## 2019-05-21 DIAGNOSIS — R112 Nausea with vomiting, unspecified: Secondary | ICD-10-CM | POA: Diagnosis not present

## 2019-05-21 DIAGNOSIS — Z743 Need for continuous supervision: Secondary | ICD-10-CM | POA: Diagnosis not present

## 2019-05-21 DIAGNOSIS — W19XXXA Unspecified fall, initial encounter: Secondary | ICD-10-CM | POA: Diagnosis not present

## 2019-05-21 DIAGNOSIS — R279 Unspecified lack of coordination: Secondary | ICD-10-CM | POA: Diagnosis not present

## 2019-05-29 DIAGNOSIS — Z20828 Contact with and (suspected) exposure to other viral communicable diseases: Secondary | ICD-10-CM | POA: Diagnosis not present

## 2019-05-29 DIAGNOSIS — U071 COVID-19: Secondary | ICD-10-CM | POA: Diagnosis not present

## 2019-06-06 ENCOUNTER — Telehealth: Payer: Self-pay | Admitting: Orthopaedic Surgery

## 2019-06-06 DIAGNOSIS — G3184 Mild cognitive impairment, so stated: Secondary | ICD-10-CM | POA: Diagnosis not present

## 2019-06-06 DIAGNOSIS — J1281 Pneumonia due to SARS-associated coronavirus: Secondary | ICD-10-CM | POA: Diagnosis not present

## 2019-06-06 DIAGNOSIS — E559 Vitamin D deficiency, unspecified: Secondary | ICD-10-CM | POA: Diagnosis not present

## 2019-06-06 DIAGNOSIS — I10 Essential (primary) hypertension: Secondary | ICD-10-CM | POA: Diagnosis not present

## 2019-06-06 DIAGNOSIS — U071 COVID-19: Secondary | ICD-10-CM | POA: Diagnosis not present

## 2019-06-06 DIAGNOSIS — N400 Enlarged prostate without lower urinary tract symptoms: Secondary | ICD-10-CM | POA: Diagnosis not present

## 2019-06-06 DIAGNOSIS — E119 Type 2 diabetes mellitus without complications: Secondary | ICD-10-CM | POA: Diagnosis not present

## 2019-06-06 DIAGNOSIS — M6281 Muscle weakness (generalized): Secondary | ICD-10-CM | POA: Diagnosis not present

## 2019-06-06 DIAGNOSIS — F0390 Unspecified dementia without behavioral disturbance: Secondary | ICD-10-CM | POA: Diagnosis not present

## 2019-06-06 NOTE — Telephone Encounter (Signed)
Last 2 ov notes faxed to [facility] [phone]

## 2019-06-07 DIAGNOSIS — M1712 Unilateral primary osteoarthritis, left knee: Secondary | ICD-10-CM | POA: Diagnosis not present

## 2019-06-08 DIAGNOSIS — N400 Enlarged prostate without lower urinary tract symptoms: Secondary | ICD-10-CM | POA: Diagnosis not present

## 2019-06-08 DIAGNOSIS — U071 COVID-19: Secondary | ICD-10-CM | POA: Diagnosis not present

## 2019-06-08 DIAGNOSIS — J1281 Pneumonia due to SARS-associated coronavirus: Secondary | ICD-10-CM | POA: Diagnosis not present

## 2019-06-08 DIAGNOSIS — F0390 Unspecified dementia without behavioral disturbance: Secondary | ICD-10-CM | POA: Diagnosis not present

## 2019-06-08 DIAGNOSIS — E119 Type 2 diabetes mellitus without complications: Secondary | ICD-10-CM | POA: Diagnosis not present

## 2019-06-08 DIAGNOSIS — E559 Vitamin D deficiency, unspecified: Secondary | ICD-10-CM | POA: Diagnosis not present

## 2019-06-08 DIAGNOSIS — M6281 Muscle weakness (generalized): Secondary | ICD-10-CM | POA: Diagnosis not present

## 2019-06-08 DIAGNOSIS — I10 Essential (primary) hypertension: Secondary | ICD-10-CM | POA: Diagnosis not present

## 2019-06-08 DIAGNOSIS — G3184 Mild cognitive impairment, so stated: Secondary | ICD-10-CM | POA: Diagnosis not present

## 2019-06-12 DIAGNOSIS — M6281 Muscle weakness (generalized): Secondary | ICD-10-CM | POA: Diagnosis not present

## 2019-06-12 DIAGNOSIS — G3184 Mild cognitive impairment, so stated: Secondary | ICD-10-CM | POA: Diagnosis not present

## 2019-06-12 DIAGNOSIS — J1281 Pneumonia due to SARS-associated coronavirus: Secondary | ICD-10-CM | POA: Diagnosis not present

## 2019-06-12 DIAGNOSIS — U071 COVID-19: Secondary | ICD-10-CM | POA: Diagnosis not present

## 2019-06-12 DIAGNOSIS — E559 Vitamin D deficiency, unspecified: Secondary | ICD-10-CM | POA: Diagnosis not present

## 2019-06-12 DIAGNOSIS — I10 Essential (primary) hypertension: Secondary | ICD-10-CM | POA: Diagnosis not present

## 2019-06-12 DIAGNOSIS — N400 Enlarged prostate without lower urinary tract symptoms: Secondary | ICD-10-CM | POA: Diagnosis not present

## 2019-06-12 DIAGNOSIS — F0390 Unspecified dementia without behavioral disturbance: Secondary | ICD-10-CM | POA: Diagnosis not present

## 2019-06-12 DIAGNOSIS — E119 Type 2 diabetes mellitus without complications: Secondary | ICD-10-CM | POA: Diagnosis not present

## 2019-06-14 DIAGNOSIS — G931 Anoxic brain damage, not elsewhere classified: Secondary | ICD-10-CM | POA: Diagnosis not present

## 2019-06-14 DIAGNOSIS — R2689 Other abnormalities of gait and mobility: Secondary | ICD-10-CM | POA: Diagnosis not present

## 2019-06-14 DIAGNOSIS — M6281 Muscle weakness (generalized): Secondary | ICD-10-CM | POA: Diagnosis not present

## 2019-06-15 DIAGNOSIS — J1281 Pneumonia due to SARS-associated coronavirus: Secondary | ICD-10-CM | POA: Diagnosis not present

## 2019-06-15 DIAGNOSIS — F0390 Unspecified dementia without behavioral disturbance: Secondary | ICD-10-CM | POA: Diagnosis not present

## 2019-06-15 DIAGNOSIS — E119 Type 2 diabetes mellitus without complications: Secondary | ICD-10-CM | POA: Diagnosis not present

## 2019-06-15 DIAGNOSIS — I10 Essential (primary) hypertension: Secondary | ICD-10-CM | POA: Diagnosis not present

## 2019-06-15 DIAGNOSIS — G3184 Mild cognitive impairment, so stated: Secondary | ICD-10-CM | POA: Diagnosis not present

## 2019-06-15 DIAGNOSIS — M6281 Muscle weakness (generalized): Secondary | ICD-10-CM | POA: Diagnosis not present

## 2019-06-15 DIAGNOSIS — E559 Vitamin D deficiency, unspecified: Secondary | ICD-10-CM | POA: Diagnosis not present

## 2019-06-15 DIAGNOSIS — N400 Enlarged prostate without lower urinary tract symptoms: Secondary | ICD-10-CM | POA: Diagnosis not present

## 2019-06-15 DIAGNOSIS — U071 COVID-19: Secondary | ICD-10-CM | POA: Diagnosis not present

## 2019-06-20 DIAGNOSIS — J1281 Pneumonia due to SARS-associated coronavirus: Secondary | ICD-10-CM | POA: Diagnosis not present

## 2019-06-20 DIAGNOSIS — E559 Vitamin D deficiency, unspecified: Secondary | ICD-10-CM | POA: Diagnosis not present

## 2019-06-20 DIAGNOSIS — E119 Type 2 diabetes mellitus without complications: Secondary | ICD-10-CM | POA: Diagnosis not present

## 2019-06-20 DIAGNOSIS — G3184 Mild cognitive impairment, so stated: Secondary | ICD-10-CM | POA: Diagnosis not present

## 2019-06-20 DIAGNOSIS — N400 Enlarged prostate without lower urinary tract symptoms: Secondary | ICD-10-CM | POA: Diagnosis not present

## 2019-06-20 DIAGNOSIS — I10 Essential (primary) hypertension: Secondary | ICD-10-CM | POA: Diagnosis not present

## 2019-06-20 DIAGNOSIS — F0390 Unspecified dementia without behavioral disturbance: Secondary | ICD-10-CM | POA: Diagnosis not present

## 2019-06-20 DIAGNOSIS — M6281 Muscle weakness (generalized): Secondary | ICD-10-CM | POA: Diagnosis not present

## 2019-06-20 DIAGNOSIS — U071 COVID-19: Secondary | ICD-10-CM | POA: Diagnosis not present

## 2019-06-22 DIAGNOSIS — J1281 Pneumonia due to SARS-associated coronavirus: Secondary | ICD-10-CM | POA: Diagnosis not present

## 2019-06-22 DIAGNOSIS — G3184 Mild cognitive impairment, so stated: Secondary | ICD-10-CM | POA: Diagnosis not present

## 2019-06-22 DIAGNOSIS — N400 Enlarged prostate without lower urinary tract symptoms: Secondary | ICD-10-CM | POA: Diagnosis not present

## 2019-06-22 DIAGNOSIS — F0390 Unspecified dementia without behavioral disturbance: Secondary | ICD-10-CM | POA: Diagnosis not present

## 2019-06-22 DIAGNOSIS — U071 COVID-19: Secondary | ICD-10-CM | POA: Diagnosis not present

## 2019-06-22 DIAGNOSIS — I10 Essential (primary) hypertension: Secondary | ICD-10-CM | POA: Diagnosis not present

## 2019-06-22 DIAGNOSIS — E119 Type 2 diabetes mellitus without complications: Secondary | ICD-10-CM | POA: Diagnosis not present

## 2019-06-22 DIAGNOSIS — E559 Vitamin D deficiency, unspecified: Secondary | ICD-10-CM | POA: Diagnosis not present

## 2019-06-22 DIAGNOSIS — M6281 Muscle weakness (generalized): Secondary | ICD-10-CM | POA: Diagnosis not present

## 2019-06-26 DIAGNOSIS — J1281 Pneumonia due to SARS-associated coronavirus: Secondary | ICD-10-CM | POA: Diagnosis not present

## 2019-06-26 DIAGNOSIS — E119 Type 2 diabetes mellitus without complications: Secondary | ICD-10-CM | POA: Diagnosis not present

## 2019-06-26 DIAGNOSIS — F0390 Unspecified dementia without behavioral disturbance: Secondary | ICD-10-CM | POA: Diagnosis not present

## 2019-06-26 DIAGNOSIS — G3184 Mild cognitive impairment, so stated: Secondary | ICD-10-CM | POA: Diagnosis not present

## 2019-06-26 DIAGNOSIS — E559 Vitamin D deficiency, unspecified: Secondary | ICD-10-CM | POA: Diagnosis not present

## 2019-06-26 DIAGNOSIS — U071 COVID-19: Secondary | ICD-10-CM | POA: Diagnosis not present

## 2019-06-26 DIAGNOSIS — M6281 Muscle weakness (generalized): Secondary | ICD-10-CM | POA: Diagnosis not present

## 2019-06-26 DIAGNOSIS — I10 Essential (primary) hypertension: Secondary | ICD-10-CM | POA: Diagnosis not present

## 2019-06-26 DIAGNOSIS — N400 Enlarged prostate without lower urinary tract symptoms: Secondary | ICD-10-CM | POA: Diagnosis not present

## 2019-06-28 DIAGNOSIS — J1281 Pneumonia due to SARS-associated coronavirus: Secondary | ICD-10-CM | POA: Diagnosis not present

## 2019-06-28 DIAGNOSIS — F0390 Unspecified dementia without behavioral disturbance: Secondary | ICD-10-CM | POA: Diagnosis not present

## 2019-06-28 DIAGNOSIS — E559 Vitamin D deficiency, unspecified: Secondary | ICD-10-CM | POA: Diagnosis not present

## 2019-06-28 DIAGNOSIS — U071 COVID-19: Secondary | ICD-10-CM | POA: Diagnosis not present

## 2019-06-28 DIAGNOSIS — I10 Essential (primary) hypertension: Secondary | ICD-10-CM | POA: Diagnosis not present

## 2019-06-28 DIAGNOSIS — E119 Type 2 diabetes mellitus without complications: Secondary | ICD-10-CM | POA: Diagnosis not present

## 2019-06-28 DIAGNOSIS — N400 Enlarged prostate without lower urinary tract symptoms: Secondary | ICD-10-CM | POA: Diagnosis not present

## 2019-06-28 DIAGNOSIS — G3184 Mild cognitive impairment, so stated: Secondary | ICD-10-CM | POA: Diagnosis not present

## 2019-06-28 DIAGNOSIS — M6281 Muscle weakness (generalized): Secondary | ICD-10-CM | POA: Diagnosis not present

## 2019-06-29 DIAGNOSIS — I10 Essential (primary) hypertension: Secondary | ICD-10-CM | POA: Diagnosis not present

## 2019-06-29 DIAGNOSIS — U071 COVID-19: Secondary | ICD-10-CM | POA: Diagnosis not present

## 2019-06-29 DIAGNOSIS — F0390 Unspecified dementia without behavioral disturbance: Secondary | ICD-10-CM | POA: Diagnosis not present

## 2019-06-29 DIAGNOSIS — J1281 Pneumonia due to SARS-associated coronavirus: Secondary | ICD-10-CM | POA: Diagnosis not present

## 2019-06-29 DIAGNOSIS — E119 Type 2 diabetes mellitus without complications: Secondary | ICD-10-CM | POA: Diagnosis not present

## 2019-06-29 DIAGNOSIS — E559 Vitamin D deficiency, unspecified: Secondary | ICD-10-CM | POA: Diagnosis not present

## 2019-06-29 DIAGNOSIS — G3184 Mild cognitive impairment, so stated: Secondary | ICD-10-CM | POA: Diagnosis not present

## 2019-06-29 DIAGNOSIS — M6281 Muscle weakness (generalized): Secondary | ICD-10-CM | POA: Diagnosis not present

## 2019-06-29 DIAGNOSIS — N400 Enlarged prostate without lower urinary tract symptoms: Secondary | ICD-10-CM | POA: Diagnosis not present

## 2019-07-02 DIAGNOSIS — E559 Vitamin D deficiency, unspecified: Secondary | ICD-10-CM | POA: Diagnosis not present

## 2019-07-02 DIAGNOSIS — E119 Type 2 diabetes mellitus without complications: Secondary | ICD-10-CM | POA: Diagnosis not present

## 2019-07-02 DIAGNOSIS — M6281 Muscle weakness (generalized): Secondary | ICD-10-CM | POA: Diagnosis not present

## 2019-07-02 DIAGNOSIS — U071 COVID-19: Secondary | ICD-10-CM | POA: Diagnosis not present

## 2019-07-02 DIAGNOSIS — I10 Essential (primary) hypertension: Secondary | ICD-10-CM | POA: Diagnosis not present

## 2019-07-02 DIAGNOSIS — G3184 Mild cognitive impairment, so stated: Secondary | ICD-10-CM | POA: Diagnosis not present

## 2019-07-02 DIAGNOSIS — N400 Enlarged prostate without lower urinary tract symptoms: Secondary | ICD-10-CM | POA: Diagnosis not present

## 2019-07-02 DIAGNOSIS — J1281 Pneumonia due to SARS-associated coronavirus: Secondary | ICD-10-CM | POA: Diagnosis not present

## 2019-07-02 DIAGNOSIS — F0390 Unspecified dementia without behavioral disturbance: Secondary | ICD-10-CM | POA: Diagnosis not present

## 2019-07-03 DIAGNOSIS — D519 Vitamin B12 deficiency anemia, unspecified: Secondary | ICD-10-CM | POA: Diagnosis not present

## 2019-07-03 DIAGNOSIS — M6281 Muscle weakness (generalized): Secondary | ICD-10-CM | POA: Diagnosis not present

## 2019-07-03 DIAGNOSIS — U071 COVID-19: Secondary | ICD-10-CM | POA: Diagnosis not present

## 2019-07-03 DIAGNOSIS — R262 Difficulty in walking, not elsewhere classified: Secondary | ICD-10-CM | POA: Diagnosis not present

## 2019-07-03 DIAGNOSIS — E1142 Type 2 diabetes mellitus with diabetic polyneuropathy: Secondary | ICD-10-CM | POA: Diagnosis not present

## 2019-07-03 DIAGNOSIS — E1165 Type 2 diabetes mellitus with hyperglycemia: Secondary | ICD-10-CM | POA: Diagnosis not present

## 2019-07-03 DIAGNOSIS — E559 Vitamin D deficiency, unspecified: Secondary | ICD-10-CM | POA: Diagnosis not present

## 2019-07-03 DIAGNOSIS — H6121 Impacted cerumen, right ear: Secondary | ICD-10-CM | POA: Diagnosis not present

## 2019-07-03 DIAGNOSIS — F0390 Unspecified dementia without behavioral disturbance: Secondary | ICD-10-CM | POA: Diagnosis not present

## 2019-07-03 DIAGNOSIS — I10 Essential (primary) hypertension: Secondary | ICD-10-CM | POA: Diagnosis not present

## 2019-07-03 DIAGNOSIS — N400 Enlarged prostate without lower urinary tract symptoms: Secondary | ICD-10-CM | POA: Diagnosis not present

## 2019-07-03 DIAGNOSIS — J1281 Pneumonia due to SARS-associated coronavirus: Secondary | ICD-10-CM | POA: Diagnosis not present

## 2019-07-03 DIAGNOSIS — E119 Type 2 diabetes mellitus without complications: Secondary | ICD-10-CM | POA: Diagnosis not present

## 2019-07-03 DIAGNOSIS — R600 Localized edema: Secondary | ICD-10-CM | POA: Diagnosis not present

## 2019-07-03 DIAGNOSIS — G3184 Mild cognitive impairment, so stated: Secondary | ICD-10-CM | POA: Diagnosis not present

## 2019-07-12 DIAGNOSIS — G3184 Mild cognitive impairment, so stated: Secondary | ICD-10-CM | POA: Diagnosis not present

## 2019-07-12 DIAGNOSIS — E559 Vitamin D deficiency, unspecified: Secondary | ICD-10-CM | POA: Diagnosis not present

## 2019-07-12 DIAGNOSIS — N400 Enlarged prostate without lower urinary tract symptoms: Secondary | ICD-10-CM | POA: Diagnosis not present

## 2019-07-12 DIAGNOSIS — M6281 Muscle weakness (generalized): Secondary | ICD-10-CM | POA: Diagnosis not present

## 2019-07-12 DIAGNOSIS — F0390 Unspecified dementia without behavioral disturbance: Secondary | ICD-10-CM | POA: Diagnosis not present

## 2019-07-12 DIAGNOSIS — U071 COVID-19: Secondary | ICD-10-CM | POA: Diagnosis not present

## 2019-07-12 DIAGNOSIS — E119 Type 2 diabetes mellitus without complications: Secondary | ICD-10-CM | POA: Diagnosis not present

## 2019-07-12 DIAGNOSIS — J1281 Pneumonia due to SARS-associated coronavirus: Secondary | ICD-10-CM | POA: Diagnosis not present

## 2019-07-12 DIAGNOSIS — I10 Essential (primary) hypertension: Secondary | ICD-10-CM | POA: Diagnosis not present

## 2019-07-13 DIAGNOSIS — E119 Type 2 diabetes mellitus without complications: Secondary | ICD-10-CM | POA: Diagnosis not present

## 2019-07-13 DIAGNOSIS — I10 Essential (primary) hypertension: Secondary | ICD-10-CM | POA: Diagnosis not present

## 2019-07-13 DIAGNOSIS — M6281 Muscle weakness (generalized): Secondary | ICD-10-CM | POA: Diagnosis not present

## 2019-07-13 DIAGNOSIS — U071 COVID-19: Secondary | ICD-10-CM | POA: Diagnosis not present

## 2019-07-13 DIAGNOSIS — N400 Enlarged prostate without lower urinary tract symptoms: Secondary | ICD-10-CM | POA: Diagnosis not present

## 2019-07-13 DIAGNOSIS — F0390 Unspecified dementia without behavioral disturbance: Secondary | ICD-10-CM | POA: Diagnosis not present

## 2019-07-13 DIAGNOSIS — E559 Vitamin D deficiency, unspecified: Secondary | ICD-10-CM | POA: Diagnosis not present

## 2019-07-13 DIAGNOSIS — G3184 Mild cognitive impairment, so stated: Secondary | ICD-10-CM | POA: Diagnosis not present

## 2019-07-13 DIAGNOSIS — J1281 Pneumonia due to SARS-associated coronavirus: Secondary | ICD-10-CM | POA: Diagnosis not present

## 2019-07-16 DIAGNOSIS — N400 Enlarged prostate without lower urinary tract symptoms: Secondary | ICD-10-CM | POA: Diagnosis not present

## 2019-07-16 DIAGNOSIS — U071 COVID-19: Secondary | ICD-10-CM | POA: Diagnosis not present

## 2019-07-16 DIAGNOSIS — E119 Type 2 diabetes mellitus without complications: Secondary | ICD-10-CM | POA: Diagnosis not present

## 2019-07-16 DIAGNOSIS — M6281 Muscle weakness (generalized): Secondary | ICD-10-CM | POA: Diagnosis not present

## 2019-07-16 DIAGNOSIS — J1281 Pneumonia due to SARS-associated coronavirus: Secondary | ICD-10-CM | POA: Diagnosis not present

## 2019-07-16 DIAGNOSIS — I10 Essential (primary) hypertension: Secondary | ICD-10-CM | POA: Diagnosis not present

## 2019-07-16 DIAGNOSIS — F0390 Unspecified dementia without behavioral disturbance: Secondary | ICD-10-CM | POA: Diagnosis not present

## 2019-07-16 DIAGNOSIS — E559 Vitamin D deficiency, unspecified: Secondary | ICD-10-CM | POA: Diagnosis not present

## 2019-07-16 DIAGNOSIS — G3184 Mild cognitive impairment, so stated: Secondary | ICD-10-CM | POA: Diagnosis not present

## 2019-07-17 DIAGNOSIS — E559 Vitamin D deficiency, unspecified: Secondary | ICD-10-CM | POA: Diagnosis not present

## 2019-07-17 DIAGNOSIS — U071 COVID-19: Secondary | ICD-10-CM | POA: Diagnosis not present

## 2019-07-17 DIAGNOSIS — E119 Type 2 diabetes mellitus without complications: Secondary | ICD-10-CM | POA: Diagnosis not present

## 2019-07-17 DIAGNOSIS — G3184 Mild cognitive impairment, so stated: Secondary | ICD-10-CM | POA: Diagnosis not present

## 2019-07-17 DIAGNOSIS — F0390 Unspecified dementia without behavioral disturbance: Secondary | ICD-10-CM | POA: Diagnosis not present

## 2019-07-17 DIAGNOSIS — N400 Enlarged prostate without lower urinary tract symptoms: Secondary | ICD-10-CM | POA: Diagnosis not present

## 2019-07-17 DIAGNOSIS — J1281 Pneumonia due to SARS-associated coronavirus: Secondary | ICD-10-CM | POA: Diagnosis not present

## 2019-07-17 DIAGNOSIS — I10 Essential (primary) hypertension: Secondary | ICD-10-CM | POA: Diagnosis not present

## 2019-07-17 DIAGNOSIS — M6281 Muscle weakness (generalized): Secondary | ICD-10-CM | POA: Diagnosis not present

## 2019-07-24 DIAGNOSIS — I10 Essential (primary) hypertension: Secondary | ICD-10-CM | POA: Diagnosis not present

## 2019-07-24 DIAGNOSIS — G3184 Mild cognitive impairment, so stated: Secondary | ICD-10-CM | POA: Diagnosis not present

## 2019-07-24 DIAGNOSIS — E559 Vitamin D deficiency, unspecified: Secondary | ICD-10-CM | POA: Diagnosis not present

## 2019-07-24 DIAGNOSIS — E119 Type 2 diabetes mellitus without complications: Secondary | ICD-10-CM | POA: Diagnosis not present

## 2019-07-24 DIAGNOSIS — N400 Enlarged prostate without lower urinary tract symptoms: Secondary | ICD-10-CM | POA: Diagnosis not present

## 2019-07-24 DIAGNOSIS — J1281 Pneumonia due to SARS-associated coronavirus: Secondary | ICD-10-CM | POA: Diagnosis not present

## 2019-07-24 DIAGNOSIS — U071 COVID-19: Secondary | ICD-10-CM | POA: Diagnosis not present

## 2019-07-24 DIAGNOSIS — F0390 Unspecified dementia without behavioral disturbance: Secondary | ICD-10-CM | POA: Diagnosis not present

## 2019-07-24 DIAGNOSIS — M6281 Muscle weakness (generalized): Secondary | ICD-10-CM | POA: Diagnosis not present

## 2019-07-25 DIAGNOSIS — E559 Vitamin D deficiency, unspecified: Secondary | ICD-10-CM | POA: Diagnosis not present

## 2019-07-25 DIAGNOSIS — F0390 Unspecified dementia without behavioral disturbance: Secondary | ICD-10-CM | POA: Diagnosis not present

## 2019-07-25 DIAGNOSIS — J1281 Pneumonia due to SARS-associated coronavirus: Secondary | ICD-10-CM | POA: Diagnosis not present

## 2019-07-25 DIAGNOSIS — G3184 Mild cognitive impairment, so stated: Secondary | ICD-10-CM | POA: Diagnosis not present

## 2019-07-25 DIAGNOSIS — N400 Enlarged prostate without lower urinary tract symptoms: Secondary | ICD-10-CM | POA: Diagnosis not present

## 2019-07-25 DIAGNOSIS — M6281 Muscle weakness (generalized): Secondary | ICD-10-CM | POA: Diagnosis not present

## 2019-07-25 DIAGNOSIS — U071 COVID-19: Secondary | ICD-10-CM | POA: Diagnosis not present

## 2019-07-25 DIAGNOSIS — E119 Type 2 diabetes mellitus without complications: Secondary | ICD-10-CM | POA: Diagnosis not present

## 2019-07-25 DIAGNOSIS — I10 Essential (primary) hypertension: Secondary | ICD-10-CM | POA: Diagnosis not present

## 2019-07-30 DIAGNOSIS — E119 Type 2 diabetes mellitus without complications: Secondary | ICD-10-CM | POA: Diagnosis not present

## 2019-07-30 DIAGNOSIS — E559 Vitamin D deficiency, unspecified: Secondary | ICD-10-CM | POA: Diagnosis not present

## 2019-07-30 DIAGNOSIS — M6281 Muscle weakness (generalized): Secondary | ICD-10-CM | POA: Diagnosis not present

## 2019-07-30 DIAGNOSIS — G3184 Mild cognitive impairment, so stated: Secondary | ICD-10-CM | POA: Diagnosis not present

## 2019-07-30 DIAGNOSIS — F0390 Unspecified dementia without behavioral disturbance: Secondary | ICD-10-CM | POA: Diagnosis not present

## 2019-07-30 DIAGNOSIS — U071 COVID-19: Secondary | ICD-10-CM | POA: Diagnosis not present

## 2019-07-30 DIAGNOSIS — N400 Enlarged prostate without lower urinary tract symptoms: Secondary | ICD-10-CM | POA: Diagnosis not present

## 2019-07-30 DIAGNOSIS — J1281 Pneumonia due to SARS-associated coronavirus: Secondary | ICD-10-CM | POA: Diagnosis not present

## 2019-07-30 DIAGNOSIS — I10 Essential (primary) hypertension: Secondary | ICD-10-CM | POA: Diagnosis not present

## 2019-07-31 DIAGNOSIS — I10 Essential (primary) hypertension: Secondary | ICD-10-CM | POA: Diagnosis not present

## 2019-07-31 DIAGNOSIS — N400 Enlarged prostate without lower urinary tract symptoms: Secondary | ICD-10-CM | POA: Diagnosis not present

## 2019-07-31 DIAGNOSIS — E119 Type 2 diabetes mellitus without complications: Secondary | ICD-10-CM | POA: Diagnosis not present

## 2019-07-31 DIAGNOSIS — U071 COVID-19: Secondary | ICD-10-CM | POA: Diagnosis not present

## 2019-07-31 DIAGNOSIS — M6281 Muscle weakness (generalized): Secondary | ICD-10-CM | POA: Diagnosis not present

## 2019-07-31 DIAGNOSIS — J1281 Pneumonia due to SARS-associated coronavirus: Secondary | ICD-10-CM | POA: Diagnosis not present

## 2019-07-31 DIAGNOSIS — G3184 Mild cognitive impairment, so stated: Secondary | ICD-10-CM | POA: Diagnosis not present

## 2019-07-31 DIAGNOSIS — F0390 Unspecified dementia without behavioral disturbance: Secondary | ICD-10-CM | POA: Diagnosis not present

## 2019-07-31 DIAGNOSIS — E559 Vitamin D deficiency, unspecified: Secondary | ICD-10-CM | POA: Diagnosis not present

## 2019-08-07 DIAGNOSIS — E1165 Type 2 diabetes mellitus with hyperglycemia: Secondary | ICD-10-CM | POA: Diagnosis not present

## 2019-08-07 DIAGNOSIS — I10 Essential (primary) hypertension: Secondary | ICD-10-CM | POA: Diagnosis not present

## 2019-08-07 DIAGNOSIS — Z139 Encounter for screening, unspecified: Secondary | ICD-10-CM | POA: Diagnosis not present

## 2019-08-07 DIAGNOSIS — D519 Vitamin B12 deficiency anemia, unspecified: Secondary | ICD-10-CM | POA: Diagnosis not present

## 2019-08-07 DIAGNOSIS — R2681 Unsteadiness on feet: Secondary | ICD-10-CM | POA: Diagnosis not present

## 2019-08-07 DIAGNOSIS — E785 Hyperlipidemia, unspecified: Secondary | ICD-10-CM | POA: Diagnosis not present

## 2019-08-07 DIAGNOSIS — E1142 Type 2 diabetes mellitus with diabetic polyneuropathy: Secondary | ICD-10-CM | POA: Diagnosis not present

## 2019-08-07 DIAGNOSIS — R600 Localized edema: Secondary | ICD-10-CM | POA: Diagnosis not present

## 2019-11-07 DIAGNOSIS — R600 Localized edema: Secondary | ICD-10-CM | POA: Diagnosis not present

## 2019-11-07 DIAGNOSIS — E1142 Type 2 diabetes mellitus with diabetic polyneuropathy: Secondary | ICD-10-CM | POA: Diagnosis not present

## 2019-11-07 DIAGNOSIS — E1165 Type 2 diabetes mellitus with hyperglycemia: Secondary | ICD-10-CM | POA: Diagnosis not present

## 2019-11-07 DIAGNOSIS — D519 Vitamin B12 deficiency anemia, unspecified: Secondary | ICD-10-CM | POA: Diagnosis not present

## 2019-11-07 DIAGNOSIS — I10 Essential (primary) hypertension: Secondary | ICD-10-CM | POA: Diagnosis not present

## 2019-11-07 DIAGNOSIS — E785 Hyperlipidemia, unspecified: Secondary | ICD-10-CM | POA: Diagnosis not present

## 2019-11-28 DIAGNOSIS — I10 Essential (primary) hypertension: Secondary | ICD-10-CM | POA: Diagnosis not present

## 2019-11-28 DIAGNOSIS — Z87891 Personal history of nicotine dependence: Secondary | ICD-10-CM | POA: Diagnosis not present

## 2019-11-28 DIAGNOSIS — M170 Bilateral primary osteoarthritis of knee: Secondary | ICD-10-CM | POA: Diagnosis not present

## 2019-11-28 DIAGNOSIS — R269 Unspecified abnormalities of gait and mobility: Secondary | ICD-10-CM | POA: Diagnosis not present

## 2019-11-28 DIAGNOSIS — R42 Dizziness and giddiness: Secondary | ICD-10-CM | POA: Diagnosis not present

## 2019-11-28 DIAGNOSIS — R011 Cardiac murmur, unspecified: Secondary | ICD-10-CM | POA: Diagnosis not present

## 2019-11-28 DIAGNOSIS — N471 Phimosis: Secondary | ICD-10-CM | POA: Diagnosis not present

## 2019-11-28 DIAGNOSIS — E119 Type 2 diabetes mellitus without complications: Secondary | ICD-10-CM | POA: Diagnosis not present

## 2019-11-28 DIAGNOSIS — Z7189 Other specified counseling: Secondary | ICD-10-CM | POA: Diagnosis not present

## 2019-11-28 DIAGNOSIS — Z7689 Persons encountering health services in other specified circumstances: Secondary | ICD-10-CM | POA: Diagnosis not present

## 2020-01-17 DIAGNOSIS — M159 Polyosteoarthritis, unspecified: Secondary | ICD-10-CM | POA: Diagnosis not present

## 2020-01-17 DIAGNOSIS — E559 Vitamin D deficiency, unspecified: Secondary | ICD-10-CM | POA: Diagnosis not present

## 2020-01-17 DIAGNOSIS — N401 Enlarged prostate with lower urinary tract symptoms: Secondary | ICD-10-CM | POA: Diagnosis not present

## 2020-01-17 DIAGNOSIS — I10 Essential (primary) hypertension: Secondary | ICD-10-CM | POA: Diagnosis not present

## 2020-01-17 DIAGNOSIS — E1142 Type 2 diabetes mellitus with diabetic polyneuropathy: Secondary | ICD-10-CM | POA: Diagnosis not present

## 2020-01-17 DIAGNOSIS — M5136 Other intervertebral disc degeneration, lumbar region: Secondary | ICD-10-CM | POA: Diagnosis not present

## 2020-01-17 DIAGNOSIS — E1165 Type 2 diabetes mellitus with hyperglycemia: Secondary | ICD-10-CM | POA: Diagnosis not present

## 2020-01-17 DIAGNOSIS — E785 Hyperlipidemia, unspecified: Secondary | ICD-10-CM | POA: Diagnosis not present

## 2020-01-17 DIAGNOSIS — D519 Vitamin B12 deficiency anemia, unspecified: Secondary | ICD-10-CM | POA: Diagnosis not present

## 2020-01-21 DIAGNOSIS — E119 Type 2 diabetes mellitus without complications: Secondary | ICD-10-CM | POA: Diagnosis not present

## 2020-01-21 DIAGNOSIS — I2510 Atherosclerotic heart disease of native coronary artery without angina pectoris: Secondary | ICD-10-CM | POA: Diagnosis not present

## 2020-01-21 DIAGNOSIS — R2689 Other abnormalities of gait and mobility: Secondary | ICD-10-CM | POA: Diagnosis not present

## 2020-01-21 DIAGNOSIS — D519 Vitamin B12 deficiency anemia, unspecified: Secondary | ICD-10-CM | POA: Diagnosis not present

## 2020-01-21 DIAGNOSIS — M5136 Other intervertebral disc degeneration, lumbar region: Secondary | ICD-10-CM | POA: Diagnosis not present

## 2020-01-21 DIAGNOSIS — K8020 Calculus of gallbladder without cholecystitis without obstruction: Secondary | ICD-10-CM | POA: Diagnosis not present

## 2020-01-21 DIAGNOSIS — S61512A Laceration without foreign body of left wrist, initial encounter: Secondary | ICD-10-CM | POA: Diagnosis not present

## 2020-01-21 DIAGNOSIS — I4519 Other right bundle-branch block: Secondary | ICD-10-CM | POA: Diagnosis not present

## 2020-01-21 DIAGNOSIS — S199XXA Unspecified injury of neck, initial encounter: Secondary | ICD-10-CM | POA: Diagnosis not present

## 2020-01-21 DIAGNOSIS — W19XXXA Unspecified fall, initial encounter: Secondary | ICD-10-CM | POA: Diagnosis not present

## 2020-01-21 DIAGNOSIS — R9341 Abnormal radiologic findings on diagnostic imaging of renal pelvis, ureter, or bladder: Secondary | ICD-10-CM | POA: Diagnosis not present

## 2020-01-21 DIAGNOSIS — I700 Atherosclerosis of aorta: Secondary | ICD-10-CM | POA: Diagnosis not present

## 2020-01-21 DIAGNOSIS — R410 Disorientation, unspecified: Secondary | ICD-10-CM | POA: Diagnosis not present

## 2020-01-21 DIAGNOSIS — E1142 Type 2 diabetes mellitus with diabetic polyneuropathy: Secondary | ICD-10-CM | POA: Diagnosis not present

## 2020-01-21 DIAGNOSIS — R404 Transient alteration of awareness: Secondary | ICD-10-CM | POA: Diagnosis not present

## 2020-01-21 DIAGNOSIS — S0990XA Unspecified injury of head, initial encounter: Secondary | ICD-10-CM | POA: Diagnosis not present

## 2020-01-21 DIAGNOSIS — R112 Nausea with vomiting, unspecified: Secondary | ICD-10-CM | POA: Diagnosis not present

## 2020-01-21 DIAGNOSIS — S299XXA Unspecified injury of thorax, initial encounter: Secondary | ICD-10-CM | POA: Diagnosis not present

## 2020-01-21 DIAGNOSIS — N401 Enlarged prostate with lower urinary tract symptoms: Secondary | ICD-10-CM | POA: Diagnosis not present

## 2020-01-21 DIAGNOSIS — Z20822 Contact with and (suspected) exposure to covid-19: Secondary | ICD-10-CM | POA: Diagnosis not present

## 2020-01-21 DIAGNOSIS — E559 Vitamin D deficiency, unspecified: Secondary | ICD-10-CM | POA: Diagnosis not present

## 2020-01-21 DIAGNOSIS — E1165 Type 2 diabetes mellitus with hyperglycemia: Secondary | ICD-10-CM | POA: Diagnosis not present

## 2020-01-21 DIAGNOSIS — E785 Hyperlipidemia, unspecified: Secondary | ICD-10-CM | POA: Diagnosis not present

## 2020-01-21 DIAGNOSIS — R109 Unspecified abdominal pain: Secondary | ICD-10-CM | POA: Diagnosis not present

## 2020-01-21 DIAGNOSIS — I10 Essential (primary) hypertension: Secondary | ICD-10-CM | POA: Diagnosis not present

## 2020-01-21 DIAGNOSIS — S41112A Laceration without foreign body of left upper arm, initial encounter: Secondary | ICD-10-CM | POA: Diagnosis not present

## 2020-01-21 DIAGNOSIS — Y998 Other external cause status: Secondary | ICD-10-CM | POA: Diagnosis not present

## 2020-01-21 DIAGNOSIS — G9341 Metabolic encephalopathy: Secondary | ICD-10-CM | POA: Diagnosis not present

## 2020-01-21 DIAGNOSIS — M159 Polyosteoarthritis, unspecified: Secondary | ICD-10-CM | POA: Diagnosis not present

## 2020-01-22 DIAGNOSIS — I4510 Unspecified right bundle-branch block: Secondary | ICD-10-CM | POA: Diagnosis not present

## 2020-01-22 DIAGNOSIS — K8020 Calculus of gallbladder without cholecystitis without obstruction: Secondary | ICD-10-CM | POA: Diagnosis not present

## 2020-01-22 DIAGNOSIS — R4182 Altered mental status, unspecified: Secondary | ICD-10-CM | POA: Diagnosis not present

## 2020-01-22 DIAGNOSIS — G9341 Metabolic encephalopathy: Secondary | ICD-10-CM | POA: Diagnosis not present

## 2020-01-23 DIAGNOSIS — G9341 Metabolic encephalopathy: Secondary | ICD-10-CM | POA: Diagnosis not present

## 2020-01-23 DIAGNOSIS — R4182 Altered mental status, unspecified: Secondary | ICD-10-CM | POA: Diagnosis not present

## 2020-01-23 DIAGNOSIS — K8020 Calculus of gallbladder without cholecystitis without obstruction: Secondary | ICD-10-CM | POA: Diagnosis not present

## 2020-01-24 DIAGNOSIS — K8020 Calculus of gallbladder without cholecystitis without obstruction: Secondary | ICD-10-CM | POA: Diagnosis not present

## 2020-01-24 DIAGNOSIS — G9341 Metabolic encephalopathy: Secondary | ICD-10-CM | POA: Diagnosis not present

## 2020-01-24 DIAGNOSIS — R4182 Altered mental status, unspecified: Secondary | ICD-10-CM | POA: Diagnosis not present

## 2020-01-25 DIAGNOSIS — G9341 Metabolic encephalopathy: Secondary | ICD-10-CM | POA: Diagnosis not present

## 2020-01-25 DIAGNOSIS — K8020 Calculus of gallbladder without cholecystitis without obstruction: Secondary | ICD-10-CM | POA: Diagnosis not present

## 2020-01-25 DIAGNOSIS — R4182 Altered mental status, unspecified: Secondary | ICD-10-CM | POA: Diagnosis not present

## 2020-01-26 DIAGNOSIS — R4182 Altered mental status, unspecified: Secondary | ICD-10-CM | POA: Diagnosis not present

## 2020-01-26 DIAGNOSIS — E119 Type 2 diabetes mellitus without complications: Secondary | ICD-10-CM | POA: Diagnosis not present

## 2020-01-26 DIAGNOSIS — I10 Essential (primary) hypertension: Secondary | ICD-10-CM | POA: Diagnosis not present

## 2020-01-26 DIAGNOSIS — F0390 Unspecified dementia without behavioral disturbance: Secondary | ICD-10-CM | POA: Diagnosis not present

## 2020-01-26 DIAGNOSIS — I119 Hypertensive heart disease without heart failure: Secondary | ICD-10-CM | POA: Diagnosis not present

## 2020-01-26 DIAGNOSIS — L988 Other specified disorders of the skin and subcutaneous tissue: Secondary | ICD-10-CM | POA: Diagnosis not present

## 2020-01-26 DIAGNOSIS — I2510 Atherosclerotic heart disease of native coronary artery without angina pectoris: Secondary | ICD-10-CM | POA: Diagnosis not present

## 2020-01-26 DIAGNOSIS — F4323 Adjustment disorder with mixed anxiety and depressed mood: Secondary | ICD-10-CM | POA: Diagnosis not present

## 2020-01-26 DIAGNOSIS — R29898 Other symptoms and signs involving the musculoskeletal system: Secondary | ICD-10-CM | POA: Diagnosis not present

## 2020-01-26 DIAGNOSIS — R262 Difficulty in walking, not elsewhere classified: Secondary | ICD-10-CM | POA: Diagnosis not present

## 2020-01-26 DIAGNOSIS — I959 Hypotension, unspecified: Secondary | ICD-10-CM | POA: Diagnosis not present

## 2020-01-26 DIAGNOSIS — R41841 Cognitive communication deficit: Secondary | ICD-10-CM | POA: Diagnosis not present

## 2020-01-26 DIAGNOSIS — R531 Weakness: Secondary | ICD-10-CM | POA: Diagnosis not present

## 2020-01-26 DIAGNOSIS — M6281 Muscle weakness (generalized): Secondary | ICD-10-CM | POA: Diagnosis not present

## 2020-01-26 DIAGNOSIS — M2550 Pain in unspecified joint: Secondary | ICD-10-CM | POA: Diagnosis not present

## 2020-01-26 DIAGNOSIS — S61512A Laceration without foreign body of left wrist, initial encounter: Secondary | ICD-10-CM | POA: Diagnosis not present

## 2020-01-26 DIAGNOSIS — R279 Unspecified lack of coordination: Secondary | ICD-10-CM | POA: Diagnosis not present

## 2020-01-26 DIAGNOSIS — R2689 Other abnormalities of gait and mobility: Secondary | ICD-10-CM | POA: Diagnosis not present

## 2020-01-26 DIAGNOSIS — Z20822 Contact with and (suspected) exposure to covid-19: Secondary | ICD-10-CM | POA: Diagnosis not present

## 2020-01-26 DIAGNOSIS — S41112A Laceration without foreign body of left upper arm, initial encounter: Secondary | ICD-10-CM | POA: Diagnosis not present

## 2020-01-26 DIAGNOSIS — R9431 Abnormal electrocardiogram [ECG] [EKG]: Secondary | ICD-10-CM | POA: Diagnosis not present

## 2020-01-26 DIAGNOSIS — G9341 Metabolic encephalopathy: Secondary | ICD-10-CM | POA: Diagnosis not present

## 2020-01-26 DIAGNOSIS — Z7401 Bed confinement status: Secondary | ICD-10-CM | POA: Diagnosis not present

## 2020-01-26 DIAGNOSIS — Z741 Need for assistance with personal care: Secondary | ICD-10-CM | POA: Diagnosis not present

## 2020-01-26 DIAGNOSIS — E1151 Type 2 diabetes mellitus with diabetic peripheral angiopathy without gangrene: Secondary | ICD-10-CM | POA: Diagnosis not present

## 2020-01-26 DIAGNOSIS — K8020 Calculus of gallbladder without cholecystitis without obstruction: Secondary | ICD-10-CM | POA: Diagnosis not present

## 2020-01-29 DIAGNOSIS — I119 Hypertensive heart disease without heart failure: Secondary | ICD-10-CM | POA: Diagnosis not present

## 2020-01-29 DIAGNOSIS — F0390 Unspecified dementia without behavioral disturbance: Secondary | ICD-10-CM | POA: Diagnosis not present

## 2020-01-29 DIAGNOSIS — E1151 Type 2 diabetes mellitus with diabetic peripheral angiopathy without gangrene: Secondary | ICD-10-CM | POA: Diagnosis not present

## 2020-01-29 DIAGNOSIS — R262 Difficulty in walking, not elsewhere classified: Secondary | ICD-10-CM | POA: Diagnosis not present

## 2020-02-12 DIAGNOSIS — L988 Other specified disorders of the skin and subcutaneous tissue: Secondary | ICD-10-CM | POA: Diagnosis not present

## 2020-02-19 DIAGNOSIS — L988 Other specified disorders of the skin and subcutaneous tissue: Secondary | ICD-10-CM | POA: Diagnosis not present

## 2020-02-26 DIAGNOSIS — L988 Other specified disorders of the skin and subcutaneous tissue: Secondary | ICD-10-CM | POA: Diagnosis not present

## 2020-02-29 DIAGNOSIS — N1831 Chronic kidney disease, stage 3a: Secondary | ICD-10-CM | POA: Diagnosis not present

## 2020-02-29 DIAGNOSIS — R5381 Other malaise: Secondary | ICD-10-CM | POA: Diagnosis not present

## 2020-02-29 DIAGNOSIS — F339 Major depressive disorder, recurrent, unspecified: Secondary | ICD-10-CM | POA: Diagnosis not present

## 2020-02-29 DIAGNOSIS — K219 Gastro-esophageal reflux disease without esophagitis: Secondary | ICD-10-CM | POA: Diagnosis not present

## 2020-03-07 DIAGNOSIS — I10 Essential (primary) hypertension: Secondary | ICD-10-CM | POA: Diagnosis not present

## 2020-03-07 DIAGNOSIS — E119 Type 2 diabetes mellitus without complications: Secondary | ICD-10-CM | POA: Diagnosis not present

## 2020-03-07 DIAGNOSIS — D649 Anemia, unspecified: Secondary | ICD-10-CM | POA: Diagnosis not present

## 2020-03-28 DIAGNOSIS — R319 Hematuria, unspecified: Secondary | ICD-10-CM | POA: Diagnosis not present

## 2020-03-28 DIAGNOSIS — N390 Urinary tract infection, site not specified: Secondary | ICD-10-CM | POA: Diagnosis not present

## 2020-04-10 DIAGNOSIS — F329 Major depressive disorder, single episode, unspecified: Secondary | ICD-10-CM | POA: Diagnosis not present

## 2020-04-10 DIAGNOSIS — F0390 Unspecified dementia without behavioral disturbance: Secondary | ICD-10-CM | POA: Diagnosis not present

## 2020-04-10 DIAGNOSIS — G4700 Insomnia, unspecified: Secondary | ICD-10-CM | POA: Diagnosis not present

## 2020-04-10 DIAGNOSIS — F419 Anxiety disorder, unspecified: Secondary | ICD-10-CM | POA: Diagnosis not present

## 2020-04-12 DIAGNOSIS — R5381 Other malaise: Secondary | ICD-10-CM | POA: Diagnosis not present

## 2020-04-12 DIAGNOSIS — M810 Age-related osteoporosis without current pathological fracture: Secondary | ICD-10-CM | POA: Diagnosis not present

## 2020-04-12 DIAGNOSIS — I739 Peripheral vascular disease, unspecified: Secondary | ICD-10-CM | POA: Diagnosis not present

## 2020-04-12 DIAGNOSIS — E1151 Type 2 diabetes mellitus with diabetic peripheral angiopathy without gangrene: Secondary | ICD-10-CM | POA: Diagnosis not present

## 2020-05-08 DIAGNOSIS — M2550 Pain in unspecified joint: Secondary | ICD-10-CM | POA: Diagnosis not present

## 2020-05-08 DIAGNOSIS — K8080 Other cholelithiasis without obstruction: Secondary | ICD-10-CM | POA: Diagnosis not present

## 2020-05-08 DIAGNOSIS — F0390 Unspecified dementia without behavioral disturbance: Secondary | ICD-10-CM | POA: Diagnosis not present

## 2020-05-08 DIAGNOSIS — F339 Major depressive disorder, recurrent, unspecified: Secondary | ICD-10-CM | POA: Diagnosis not present

## 2020-05-22 DIAGNOSIS — F418 Other specified anxiety disorders: Secondary | ICD-10-CM | POA: Diagnosis not present

## 2020-05-22 DIAGNOSIS — F338 Other recurrent depressive disorders: Secondary | ICD-10-CM | POA: Diagnosis not present

## 2020-05-22 DIAGNOSIS — F0390 Unspecified dementia without behavioral disturbance: Secondary | ICD-10-CM | POA: Diagnosis not present

## 2020-05-22 DIAGNOSIS — G4700 Insomnia, unspecified: Secondary | ICD-10-CM | POA: Diagnosis not present

## 2020-06-11 DIAGNOSIS — N1831 Chronic kidney disease, stage 3a: Secondary | ICD-10-CM | POA: Diagnosis not present

## 2020-06-11 DIAGNOSIS — R5381 Other malaise: Secondary | ICD-10-CM | POA: Diagnosis not present

## 2020-06-11 DIAGNOSIS — K219 Gastro-esophageal reflux disease without esophagitis: Secondary | ICD-10-CM | POA: Diagnosis not present

## 2020-06-11 DIAGNOSIS — F339 Major depressive disorder, recurrent, unspecified: Secondary | ICD-10-CM | POA: Diagnosis not present

## 2020-07-07 DIAGNOSIS — F0390 Unspecified dementia without behavioral disturbance: Secondary | ICD-10-CM | POA: Diagnosis not present

## 2020-07-07 DIAGNOSIS — F418 Other specified anxiety disorders: Secondary | ICD-10-CM | POA: Diagnosis not present

## 2020-07-07 DIAGNOSIS — G4700 Insomnia, unspecified: Secondary | ICD-10-CM | POA: Diagnosis not present

## 2020-07-07 DIAGNOSIS — F338 Other recurrent depressive disorders: Secondary | ICD-10-CM | POA: Diagnosis not present

## 2020-07-11 DIAGNOSIS — M810 Age-related osteoporosis without current pathological fracture: Secondary | ICD-10-CM | POA: Diagnosis not present

## 2020-07-11 DIAGNOSIS — I739 Peripheral vascular disease, unspecified: Secondary | ICD-10-CM | POA: Diagnosis not present

## 2020-07-11 DIAGNOSIS — F0390 Unspecified dementia without behavioral disturbance: Secondary | ICD-10-CM | POA: Diagnosis not present

## 2020-07-11 DIAGNOSIS — E1142 Type 2 diabetes mellitus with diabetic polyneuropathy: Secondary | ICD-10-CM | POA: Diagnosis not present

## 2020-07-22 DIAGNOSIS — M2141 Flat foot [pes planus] (acquired), right foot: Secondary | ICD-10-CM | POA: Diagnosis not present

## 2020-07-22 DIAGNOSIS — B351 Tinea unguium: Secondary | ICD-10-CM | POA: Diagnosis not present

## 2020-07-22 DIAGNOSIS — M2142 Flat foot [pes planus] (acquired), left foot: Secondary | ICD-10-CM | POA: Diagnosis not present

## 2020-07-22 DIAGNOSIS — M79675 Pain in left toe(s): Secondary | ICD-10-CM | POA: Diagnosis not present

## 2020-07-22 DIAGNOSIS — L603 Nail dystrophy: Secondary | ICD-10-CM | POA: Diagnosis not present

## 2020-07-22 DIAGNOSIS — I739 Peripheral vascular disease, unspecified: Secondary | ICD-10-CM | POA: Diagnosis not present

## 2020-07-22 DIAGNOSIS — R262 Difficulty in walking, not elsewhere classified: Secondary | ICD-10-CM | POA: Diagnosis not present

## 2020-07-22 DIAGNOSIS — M79674 Pain in right toe(s): Secondary | ICD-10-CM | POA: Diagnosis not present

## 2020-08-11 DIAGNOSIS — I119 Hypertensive heart disease without heart failure: Secondary | ICD-10-CM | POA: Diagnosis not present

## 2020-08-11 DIAGNOSIS — M2550 Pain in unspecified joint: Secondary | ICD-10-CM | POA: Diagnosis not present

## 2020-08-11 DIAGNOSIS — K5900 Constipation, unspecified: Secondary | ICD-10-CM | POA: Diagnosis not present

## 2020-08-11 DIAGNOSIS — K8080 Other cholelithiasis without obstruction: Secondary | ICD-10-CM | POA: Diagnosis not present

## 2020-08-14 DIAGNOSIS — F418 Other specified anxiety disorders: Secondary | ICD-10-CM | POA: Diagnosis not present

## 2020-08-14 DIAGNOSIS — G4700 Insomnia, unspecified: Secondary | ICD-10-CM | POA: Diagnosis not present

## 2020-08-14 DIAGNOSIS — F338 Other recurrent depressive disorders: Secondary | ICD-10-CM | POA: Diagnosis not present

## 2020-08-14 DIAGNOSIS — F0390 Unspecified dementia without behavioral disturbance: Secondary | ICD-10-CM | POA: Diagnosis not present

## 2020-09-04 DIAGNOSIS — E119 Type 2 diabetes mellitus without complications: Secondary | ICD-10-CM | POA: Diagnosis not present

## 2020-09-04 DIAGNOSIS — D649 Anemia, unspecified: Secondary | ICD-10-CM | POA: Diagnosis not present

## 2020-09-04 DIAGNOSIS — I509 Heart failure, unspecified: Secondary | ICD-10-CM | POA: Diagnosis not present

## 2020-09-10 DIAGNOSIS — E1151 Type 2 diabetes mellitus with diabetic peripheral angiopathy without gangrene: Secondary | ICD-10-CM | POA: Diagnosis not present

## 2020-09-10 DIAGNOSIS — F339 Major depressive disorder, recurrent, unspecified: Secondary | ICD-10-CM | POA: Diagnosis not present

## 2020-09-10 DIAGNOSIS — N1831 Chronic kidney disease, stage 3a: Secondary | ICD-10-CM | POA: Diagnosis not present

## 2020-09-10 DIAGNOSIS — R5381 Other malaise: Secondary | ICD-10-CM | POA: Diagnosis not present

## 2020-10-03 DIAGNOSIS — F0390 Unspecified dementia without behavioral disturbance: Secondary | ICD-10-CM | POA: Diagnosis not present

## 2020-10-03 DIAGNOSIS — F418 Other specified anxiety disorders: Secondary | ICD-10-CM | POA: Diagnosis not present

## 2020-10-03 DIAGNOSIS — G4700 Insomnia, unspecified: Secondary | ICD-10-CM | POA: Diagnosis not present

## 2020-10-03 DIAGNOSIS — F338 Other recurrent depressive disorders: Secondary | ICD-10-CM | POA: Diagnosis not present

## 2020-10-09 DIAGNOSIS — M2042 Other hammer toe(s) (acquired), left foot: Secondary | ICD-10-CM | POA: Diagnosis not present

## 2020-10-09 DIAGNOSIS — I739 Peripheral vascular disease, unspecified: Secondary | ICD-10-CM | POA: Diagnosis not present

## 2020-10-09 DIAGNOSIS — M2041 Other hammer toe(s) (acquired), right foot: Secondary | ICD-10-CM | POA: Diagnosis not present

## 2020-10-09 DIAGNOSIS — B351 Tinea unguium: Secondary | ICD-10-CM | POA: Diagnosis not present

## 2020-10-09 DIAGNOSIS — L603 Nail dystrophy: Secondary | ICD-10-CM | POA: Diagnosis not present

## 2020-10-10 DIAGNOSIS — M810 Age-related osteoporosis without current pathological fracture: Secondary | ICD-10-CM | POA: Diagnosis not present

## 2020-10-10 DIAGNOSIS — I739 Peripheral vascular disease, unspecified: Secondary | ICD-10-CM | POA: Diagnosis not present

## 2020-10-10 DIAGNOSIS — F0390 Unspecified dementia without behavioral disturbance: Secondary | ICD-10-CM | POA: Diagnosis not present

## 2020-10-10 DIAGNOSIS — E1142 Type 2 diabetes mellitus with diabetic polyneuropathy: Secondary | ICD-10-CM | POA: Diagnosis not present

## 2020-11-06 DIAGNOSIS — G4700 Insomnia, unspecified: Secondary | ICD-10-CM | POA: Diagnosis not present

## 2020-11-06 DIAGNOSIS — F0390 Unspecified dementia without behavioral disturbance: Secondary | ICD-10-CM | POA: Diagnosis not present

## 2020-11-06 DIAGNOSIS — F418 Other specified anxiety disorders: Secondary | ICD-10-CM | POA: Diagnosis not present

## 2020-11-06 DIAGNOSIS — F338 Other recurrent depressive disorders: Secondary | ICD-10-CM | POA: Diagnosis not present

## 2020-11-10 DIAGNOSIS — F339 Major depressive disorder, recurrent, unspecified: Secondary | ICD-10-CM | POA: Diagnosis not present

## 2020-11-10 DIAGNOSIS — I119 Hypertensive heart disease without heart failure: Secondary | ICD-10-CM | POA: Diagnosis not present

## 2020-11-10 DIAGNOSIS — M2550 Pain in unspecified joint: Secondary | ICD-10-CM | POA: Diagnosis not present

## 2020-11-10 DIAGNOSIS — K5900 Constipation, unspecified: Secondary | ICD-10-CM | POA: Diagnosis not present

## 2020-12-04 DIAGNOSIS — F338 Other recurrent depressive disorders: Secondary | ICD-10-CM | POA: Diagnosis not present

## 2020-12-04 DIAGNOSIS — G4700 Insomnia, unspecified: Secondary | ICD-10-CM | POA: Diagnosis not present

## 2020-12-04 DIAGNOSIS — F0390 Unspecified dementia without behavioral disturbance: Secondary | ICD-10-CM | POA: Diagnosis not present

## 2020-12-04 DIAGNOSIS — F418 Other specified anxiety disorders: Secondary | ICD-10-CM | POA: Diagnosis not present

## 2020-12-08 DIAGNOSIS — N1831 Chronic kidney disease, stage 3a: Secondary | ICD-10-CM | POA: Diagnosis not present

## 2020-12-08 DIAGNOSIS — R5381 Other malaise: Secondary | ICD-10-CM | POA: Diagnosis not present

## 2020-12-08 DIAGNOSIS — K219 Gastro-esophageal reflux disease without esophagitis: Secondary | ICD-10-CM | POA: Diagnosis not present

## 2020-12-08 DIAGNOSIS — F339 Major depressive disorder, recurrent, unspecified: Secondary | ICD-10-CM | POA: Diagnosis not present

## 2020-12-11 DIAGNOSIS — M2041 Other hammer toe(s) (acquired), right foot: Secondary | ICD-10-CM | POA: Diagnosis not present

## 2020-12-11 DIAGNOSIS — L603 Nail dystrophy: Secondary | ICD-10-CM | POA: Diagnosis not present

## 2020-12-11 DIAGNOSIS — M2042 Other hammer toe(s) (acquired), left foot: Secondary | ICD-10-CM | POA: Diagnosis not present

## 2020-12-11 DIAGNOSIS — B351 Tinea unguium: Secondary | ICD-10-CM | POA: Diagnosis not present

## 2020-12-11 DIAGNOSIS — I739 Peripheral vascular disease, unspecified: Secondary | ICD-10-CM | POA: Diagnosis not present

## 2021-01-01 DIAGNOSIS — R2689 Other abnormalities of gait and mobility: Secondary | ICD-10-CM | POA: Diagnosis not present

## 2021-01-01 DIAGNOSIS — M810 Age-related osteoporosis without current pathological fracture: Secondary | ICD-10-CM | POA: Diagnosis not present

## 2021-01-01 DIAGNOSIS — F0390 Unspecified dementia without behavioral disturbance: Secondary | ICD-10-CM | POA: Diagnosis not present

## 2021-01-01 DIAGNOSIS — M6281 Muscle weakness (generalized): Secondary | ICD-10-CM | POA: Diagnosis not present

## 2021-01-06 DIAGNOSIS — M810 Age-related osteoporosis without current pathological fracture: Secondary | ICD-10-CM | POA: Diagnosis not present

## 2021-01-06 DIAGNOSIS — F0390 Unspecified dementia without behavioral disturbance: Secondary | ICD-10-CM | POA: Diagnosis not present

## 2021-01-06 DIAGNOSIS — R2689 Other abnormalities of gait and mobility: Secondary | ICD-10-CM | POA: Diagnosis not present

## 2021-01-06 DIAGNOSIS — M6281 Muscle weakness (generalized): Secondary | ICD-10-CM | POA: Diagnosis not present

## 2021-01-07 DIAGNOSIS — F0390 Unspecified dementia without behavioral disturbance: Secondary | ICD-10-CM | POA: Diagnosis not present

## 2021-01-07 DIAGNOSIS — M6281 Muscle weakness (generalized): Secondary | ICD-10-CM | POA: Diagnosis not present

## 2021-01-07 DIAGNOSIS — R2689 Other abnormalities of gait and mobility: Secondary | ICD-10-CM | POA: Diagnosis not present

## 2021-01-07 DIAGNOSIS — M810 Age-related osteoporosis without current pathological fracture: Secondary | ICD-10-CM | POA: Diagnosis not present

## 2021-01-09 DIAGNOSIS — K5900 Constipation, unspecified: Secondary | ICD-10-CM | POA: Diagnosis not present

## 2021-01-09 DIAGNOSIS — M2550 Pain in unspecified joint: Secondary | ICD-10-CM | POA: Diagnosis not present

## 2021-01-09 DIAGNOSIS — R5381 Other malaise: Secondary | ICD-10-CM | POA: Diagnosis not present

## 2021-01-09 DIAGNOSIS — F339 Major depressive disorder, recurrent, unspecified: Secondary | ICD-10-CM | POA: Diagnosis not present

## 2021-01-15 DIAGNOSIS — F0390 Unspecified dementia without behavioral disturbance: Secondary | ICD-10-CM | POA: Diagnosis not present

## 2021-01-15 DIAGNOSIS — F338 Other recurrent depressive disorders: Secondary | ICD-10-CM | POA: Diagnosis not present

## 2021-01-15 DIAGNOSIS — F5101 Primary insomnia: Secondary | ICD-10-CM | POA: Diagnosis not present

## 2021-01-15 DIAGNOSIS — F418 Other specified anxiety disorders: Secondary | ICD-10-CM | POA: Diagnosis not present

## 2021-01-21 DIAGNOSIS — R2689 Other abnormalities of gait and mobility: Secondary | ICD-10-CM | POA: Diagnosis not present

## 2021-01-21 DIAGNOSIS — F0390 Unspecified dementia without behavioral disturbance: Secondary | ICD-10-CM | POA: Diagnosis not present

## 2021-01-21 DIAGNOSIS — M6281 Muscle weakness (generalized): Secondary | ICD-10-CM | POA: Diagnosis not present

## 2021-01-21 DIAGNOSIS — M810 Age-related osteoporosis without current pathological fracture: Secondary | ICD-10-CM | POA: Diagnosis not present

## 2021-01-27 DIAGNOSIS — R2689 Other abnormalities of gait and mobility: Secondary | ICD-10-CM | POA: Diagnosis not present

## 2021-01-27 DIAGNOSIS — M6281 Muscle weakness (generalized): Secondary | ICD-10-CM | POA: Diagnosis not present

## 2021-01-27 DIAGNOSIS — M810 Age-related osteoporosis without current pathological fracture: Secondary | ICD-10-CM | POA: Diagnosis not present

## 2021-01-27 DIAGNOSIS — F0390 Unspecified dementia without behavioral disturbance: Secondary | ICD-10-CM | POA: Diagnosis not present

## 2021-01-28 DIAGNOSIS — F0390 Unspecified dementia without behavioral disturbance: Secondary | ICD-10-CM | POA: Diagnosis not present

## 2021-01-28 DIAGNOSIS — R2689 Other abnormalities of gait and mobility: Secondary | ICD-10-CM | POA: Diagnosis not present

## 2021-01-28 DIAGNOSIS — M810 Age-related osteoporosis without current pathological fracture: Secondary | ICD-10-CM | POA: Diagnosis not present

## 2021-01-28 DIAGNOSIS — M6281 Muscle weakness (generalized): Secondary | ICD-10-CM | POA: Diagnosis not present

## 2021-02-03 DIAGNOSIS — I509 Heart failure, unspecified: Secondary | ICD-10-CM | POA: Diagnosis not present

## 2021-02-03 DIAGNOSIS — E119 Type 2 diabetes mellitus without complications: Secondary | ICD-10-CM | POA: Diagnosis not present

## 2021-02-03 DIAGNOSIS — I10 Essential (primary) hypertension: Secondary | ICD-10-CM | POA: Diagnosis not present

## 2021-02-03 DIAGNOSIS — D649 Anemia, unspecified: Secondary | ICD-10-CM | POA: Diagnosis not present

## 2021-02-09 DIAGNOSIS — E1151 Type 2 diabetes mellitus with diabetic peripheral angiopathy without gangrene: Secondary | ICD-10-CM | POA: Diagnosis not present

## 2021-02-09 DIAGNOSIS — I739 Peripheral vascular disease, unspecified: Secondary | ICD-10-CM | POA: Diagnosis not present

## 2021-02-09 DIAGNOSIS — M810 Age-related osteoporosis without current pathological fracture: Secondary | ICD-10-CM | POA: Diagnosis not present

## 2021-02-09 DIAGNOSIS — F0390 Unspecified dementia without behavioral disturbance: Secondary | ICD-10-CM | POA: Diagnosis not present

## 2021-03-05 DIAGNOSIS — N183 Chronic kidney disease, stage 3 unspecified: Secondary | ICD-10-CM | POA: Diagnosis not present

## 2021-03-05 DIAGNOSIS — E119 Type 2 diabetes mellitus without complications: Secondary | ICD-10-CM | POA: Diagnosis not present

## 2021-03-05 DIAGNOSIS — R9431 Abnormal electrocardiogram [ECG] [EKG]: Secondary | ICD-10-CM | POA: Diagnosis not present

## 2021-03-11 DIAGNOSIS — K5900 Constipation, unspecified: Secondary | ICD-10-CM | POA: Diagnosis not present

## 2021-03-11 DIAGNOSIS — I119 Hypertensive heart disease without heart failure: Secondary | ICD-10-CM | POA: Diagnosis not present

## 2021-03-11 DIAGNOSIS — N1831 Chronic kidney disease, stage 3a: Secondary | ICD-10-CM | POA: Diagnosis not present

## 2021-03-11 DIAGNOSIS — K219 Gastro-esophageal reflux disease without esophagitis: Secondary | ICD-10-CM | POA: Diagnosis not present

## 2021-03-14 DIAGNOSIS — R059 Cough, unspecified: Secondary | ICD-10-CM | POA: Diagnosis not present

## 2021-03-17 DIAGNOSIS — F068 Other specified mental disorders due to known physiological condition: Secondary | ICD-10-CM | POA: Diagnosis not present

## 2021-04-13 DEATH — deceased
